# Patient Record
Sex: Female | Born: 2000 | Race: Black or African American | Hispanic: No | Marital: Single | Smoking: Never smoker
Health system: Southern US, Community
[De-identification: ages and names within clinical notes are randomized; demographics above are authoritative.]

## PROBLEM LIST (undated history)

## (undated) DIAGNOSIS — J45909 Unspecified asthma, uncomplicated: Secondary | ICD-10-CM

## (undated) HISTORY — PX: NO PAST SURGERIES: SHX2092

---

## 2019-03-23 ENCOUNTER — Inpatient Hospital Stay (HOSPITAL_COMMUNITY)
Admission: AD | Admit: 2019-03-23 | Discharge: 2019-03-23 | Disposition: A | Discharge status: Home / Self Care | Payer: PRIVATE HEALTH INSURANCE | Attending: Family Medicine | Admitting: Family Medicine

## 2019-03-23 ENCOUNTER — Inpatient Hospital Stay (HOSPITAL_COMMUNITY): Payer: PRIVATE HEALTH INSURANCE

## 2019-03-23 ENCOUNTER — Encounter (HOSPITAL_COMMUNITY): Payer: Self-pay | Admitting: Family Medicine

## 2019-03-23 ENCOUNTER — Other Ambulatory Visit: Payer: Self-pay

## 2019-03-23 DIAGNOSIS — O26891 Other specified pregnancy related conditions, first trimester: Secondary | ICD-10-CM | POA: Insufficient documentation

## 2019-03-23 DIAGNOSIS — Z3491 Encounter for supervision of normal pregnancy, unspecified, first trimester: Secondary | ICD-10-CM

## 2019-03-23 DIAGNOSIS — O209 Hemorrhage in early pregnancy, unspecified: Secondary | ICD-10-CM | POA: Diagnosis present

## 2019-03-23 DIAGNOSIS — O200 Threatened abortion: Secondary | ICD-10-CM

## 2019-03-23 DIAGNOSIS — J45909 Unspecified asthma, uncomplicated: Secondary | ICD-10-CM | POA: Diagnosis not present

## 2019-03-23 DIAGNOSIS — Z3A11 11 weeks gestation of pregnancy: Secondary | ICD-10-CM | POA: Insufficient documentation

## 2019-03-23 DIAGNOSIS — O99511 Diseases of the respiratory system complicating pregnancy, first trimester: Secondary | ICD-10-CM | POA: Diagnosis not present

## 2019-03-23 DIAGNOSIS — R109 Unspecified abdominal pain: Secondary | ICD-10-CM | POA: Insufficient documentation

## 2019-03-23 HISTORY — DX: Unspecified asthma, uncomplicated: J45.909

## 2019-03-23 LAB — HCG, QUANTITATIVE, PREGNANCY: hCG, Beta Chain, Quant, S: 147214 m[IU]/mL — ABNORMAL HIGH (ref <5)

## 2019-03-23 LAB — CBC
HCT: 33.5 % — ABNORMAL LOW (ref 36.0–46.0)
Hemoglobin: 11.9 g/dL — ABNORMAL LOW (ref 12.0–15.0)
MCH: 28.3 pg (ref 26.0–34.0)
MCHC: 35.5 g/dL (ref 30.0–36.0)
MCV: 79.6 fL — ABNORMAL LOW (ref 80.0–100.0)
Platelets: 228 10*3/uL (ref 150–400)
RBC: 4.21 MIL/uL (ref 3.87–5.11)
RDW: 13.2 % (ref 11.5–15.5)
WBC: 8.9 10*3/uL (ref 4.0–10.5)
nRBC: 0 % (ref 0.0–0.2)

## 2019-03-23 LAB — ABO/RH: ABO/RH(D): A POS

## 2019-03-23 LAB — WET PREP, GENITAL
Clue Cells Wet Prep HPF POC: NONE SEEN
Sperm: NONE SEEN
Trich, Wet Prep: NONE SEEN
Yeast Wet Prep HPF POC: NONE SEEN

## 2019-03-23 NOTE — MAU Provider Note (Signed)
Chief Complaint: Vaginal Bleeding and Abdominal Pain   First Provider Initiated Contact with Patient 03/23/19 1154     SUBJECTIVE HPI: Patricia Parsons is a 19 y.o. G1P0 at [redacted]w[redacted]d who presents to Maternity Admissions reporting vaginal bleeding. Sure LMP of 10/21. Had her first positive pregnancy test in November. Has not had any evaluation yet during this pregnancy.  Symptoms started this morning. Initially had a change in discharge from white to tan. No irritation or odor. Started with pink spotting this morning and is now more red and passing very small blood clots. Has been wearing panty liners but not saturating them. Also reports some mild abdominal cramping. No other symptoms. No recent intercourse.   Location: abdomen Quality: cramping Severity: 3/10 on pain scale Duration: 2 days Timing: intermittent Modifying factors: none Associated signs and symptoms: vaginal bleeding  Past Medical History:  Diagnosis Date  . Asthma    as child   OB History  Gravida Para Term Preterm AB Living  1            SAB TAB Ectopic Multiple Live Births               # Outcome Date GA Lbr Len/2nd Weight Sex Delivery Anes PTL Lv  1 Current            Past Surgical History:  Procedure Laterality Date  . NO PAST SURGERIES     Social History   Socioeconomic History  . Marital status: Single    Spouse name: Not on file  . Number of children: Not on file  . Years of education: Not on file  . Highest education level: Not on file  Occupational History  . Not on file  Tobacco Use  . Smoking status: Never Smoker  . Smokeless tobacco: Never Used  Substance and Sexual Activity  . Alcohol use: Never  . Drug use: Never  . Sexual activity: Yes  Other Topics Concern  . Not on file  Social History Narrative  . Not on file   Social Determinants of Health   Financial Resource Strain:   . Difficulty of Paying Living Expenses: Not on file  Food Insecurity:   . Worried About Charity fundraiser in  the Last Year: Not on file  . Ran Out of Food in the Last Year: Not on file  Transportation Needs:   . Lack of Transportation (Medical): Not on file  . Lack of Transportation (Non-Medical): Not on file  Physical Activity:   . Days of Exercise per Week: Not on file  . Minutes of Exercise per Session: Not on file  Stress:   . Feeling of Stress : Not on file  Social Connections:   . Frequency of Communication with Friends and Family: Not on file  . Frequency of Social Gatherings with Friends and Family: Not on file  . Attends Religious Services: Not on file  . Active Member of Clubs or Organizations: Not on file  . Attends Archivist Meetings: Not on file  . Marital Status: Not on file  Intimate Partner Violence:   . Fear of Current or Ex-Partner: Not on file  . Emotionally Abused: Not on file  . Physically Abused: Not on file  . Sexually Abused: Not on file   Family History  Problem Relation Age of Onset  . Healthy Mother   . Healthy Father   . Cancer Maternal Grandmother    No current facility-administered medications on file prior to encounter.  Current Outpatient Medications on File Prior to Encounter  Medication Sig Dispense Refill  . prenatal vitamin w/FE, FA (PRENATAL 1 + 1) 27-1 MG TABS tablet Take 1 tablet by mouth daily at 12 noon.     No Known Allergies  I have reviewed patient's Past Medical Hx, Surgical Hx, Family Hx, Social Hx, medications and allergies.   Review of Systems  Constitutional: Negative.   Gastrointestinal: Positive for abdominal pain.  Genitourinary: Positive for vaginal bleeding and vaginal discharge.    OBJECTIVE Patient Vitals for the past 24 hrs:  BP Temp Temp src Pulse Resp SpO2 Height Weight  03/23/19 1405 139/86 -- -- 88 -- -- -- --  03/23/19 1126 137/82 98.2 F (36.8 C) Oral 98 20 100 % 5\' 6"  (1.676 m) 63.5 kg   Constitutional: Well-developed, well-nourished female in no acute distress.  Cardiovascular: normal rate &  rhythm, no murmur Respiratory: normal rate and effort. Lung sounds clear throughout GI: Abd soft, non-tender, Pos BS x 4. No guarding or rebound tenderness MS: Extremities nontender, no edema, normal ROM Neurologic: Alert and oriented x 4.  GU:     SPECULUM EXAM: NEFG, minimal amount of dark red mucoid blood coming from os  BIMANUAL: No CMT. cervix closed; uterus normal size, no adnexal tenderness or masses.    LAB RESULTS Results for orders placed or performed during the hospital encounter of 03/23/19 (from the past 24 hour(s))  Wet prep, genital     Status: Abnormal   Collection Time: 03/23/19 12:02 PM   Specimen: Cervix  Result Value Ref Range   Yeast Wet Prep HPF POC NONE SEEN NONE SEEN   Trich, Wet Prep NONE SEEN NONE SEEN   Clue Cells Wet Prep HPF POC NONE SEEN NONE SEEN   WBC, Wet Prep HPF POC MANY (A) NONE SEEN   Sperm NONE SEEN   CBC     Status: Abnormal   Collection Time: 03/23/19 12:11 PM  Result Value Ref Range   WBC 8.9 4.0 - 10.5 K/uL   RBC 4.21 3.87 - 5.11 MIL/uL   Hemoglobin 11.9 (L) 12.0 - 15.0 g/dL   HCT 05/21/19 (L) 16.1 - 09.6 %   MCV 79.6 (L) 80.0 - 100.0 fL   MCH 28.3 26.0 - 34.0 pg   MCHC 35.5 30.0 - 36.0 g/dL   RDW 04.5 40.9 - 81.1 %   Platelets 228 150 - 400 K/uL   nRBC 0.0 0.0 - 0.2 %  ABO/Rh     Status: None   Collection Time: 03/23/19 12:11 PM  Result Value Ref Range   ABO/RH(D)      A POS Performed at [facility], [address] [place], [place]   hCG, quantitative, pregnancy     Status: Abnormal   Collection Time: 03/23/19 12:11 PM  Result Value Ref Range   hCG, Beta Chain, Quant, S 147,214 (H) <5 mIU/mL    IMAGING 05/21/19 OB LESS THAN 14 WEEKS WITH OB TRANSVAGINAL  Result Date: 03/23/2019 CLINICAL DATA:  Vaginal bleeding EXAM: OBSTETRIC <14 WK 05/21/2019 AND TRANSVAGINAL OB US TECHNIQUE: Both transabdominal and transvaginal ultrasound examinations were performed for complete evaluation of the gestation as well as the maternal uterus,  adnexal regions, and pelvic cul-de-sac. Transvaginal technique was performed to assess early pregnancy. COMPARISON:  None. FINDINGS: Intrauterine gestational sac: Single Yolk sac:  Visualized Embryo:  Visualized Cardiac Activity: Visualized Heart Rate: 179 bpm MSD:   mm    w     d  CRL:  42.5 mm   11 w   1 d                  [place] EDC: 10/11/2019 Subchorionic hemorrhage:  None visualized. Maternal uterus/adnexae: No adnexal mass or free fluid IMPRESSION: Eleven week 1 day intrauterine pregnancy. Fetal heart rate 179 beats per minute. No acute maternal findings. Electronically Signed   By: Charlett Nose M.D.   On: 03/23/2019 13:06    MAU COURSE Orders Placed This Encounter  Procedures  . Wet prep, genital  . US OB LESS THAN 14 WEEKS WITH OB TRANSVAGINAL  . CBC  . hCG, quantitative, pregnancy  . ABO/Rh  . Discharge patient   No orders of the defined types were placed in this encounter.   MDM +UPT UA, wet prep, GC/chlamydia, CBC, ABO/Rh, quant hCG, and [place] today to rule out ectopic pregnancy which can be life threatening.   RH positive Wet prep negative  Ultrasound shows live IUP.   ASSESSMENT 1. Threatened miscarriage   2. Vaginal bleeding in pregnancy, first trimester   3. Normal IUP (intrauterine pregnancy) on prenatal ultrasound, first trimester   4. [redacted] weeks gestation of pregnancy     PLAN Discharge home in stable condition. Bleeding precautions Start prenatal care GC/CT pending  Allergies as of 03/23/2019   No Known Allergies     Medication List    TAKE these medications   prenatal vitamin w/FE, FA 27-1 MG Tabs tablet Take 1 tablet by mouth daily at 12 noon.        Judeth Horn, NP 03/23/2019  2:29 PM

## 2019-03-23 NOTE — Discharge Instructions (Signed)
Vaginal Bleeding During Pregnancy, First Trimester  A small amount of bleeding from the vagina (spotting) is relatively common during early pregnancy. It usually stops on its own. Various things may cause bleeding or spotting during early pregnancy. Some bleeding may be related to the pregnancy, and some may not. In many cases, the bleeding is normal and is not a problem. However, bleeding can also be a sign of something serious. Be sure to tell your health care provider about any vaginal bleeding right away. Some possible causes of vaginal bleeding during the first trimester include:  Infection or inflammation of the cervix.  Growths (polyps) on the cervix.  Miscarriage or threatened miscarriage.  Pregnancy tissue developing outside of the uterus (ectopic pregnancy).  A mass of tissue developing in the uterus due to an egg being fertilized incorrectly (molar pregnancy). Follow these instructions at home: Activity  Follow instructions from your health care provider about limiting your activity. Ask what activities are safe for you.  If needed, make plans for someone to help with your regular activities.  Do not have sex or orgasms until your health care provider says that this is safe. General instructions  Take over-the-counter and prescription medicines only as told by your health care provider.  Pay attention to any changes in your symptoms.  Do not use tampons or douche.  Write down how many pads you use each day, how often you change pads, and how soaked (saturated) they are.  If you pass any tissue from your vagina, save the tissue so you can show it to your health care provider.  Keep all follow-up visits as told by your health care provider. This is important. Contact a health care provider if:  You have vaginal bleeding during any part of your pregnancy.  You have cramps or labor pains.  You have a fever. Get help right away if:  You have severe cramps in your  back or abdomen.  You pass large clots or a large amount of tissue from your vagina.  Your bleeding increases.  You feel light-headed or weak, or you faint.  You have chills.  You are leaking fluid or have a gush of fluid from your vagina. Summary  A small amount of bleeding (spotting) from the vagina is relatively common during early pregnancy.  Various things may cause bleeding or spotting in early pregnancy.  Be sure to tell your health care provider about any vaginal bleeding right away. This information is not intended to replace advice given to you by your health care provider. Make sure you discuss any questions you have with your health care provider. Document Revised: 06/20/2018 Document Reviewed: 06/03/2016 Elsevier Patient Education  2020 [place] for [place] at [facility]       Phone: [phone]  [facility] [place] at [place] Phone: [place] for [employer] at [place]  Phone: [place] for [place] at [facility]  Phone: [place] for [place] at [place]  Phone: [facility]       Phone: [phone]  [facility]    Phone: [phone]   [facility])    Phone: [facility]    Phone: [phone]  [facility]    Phone: [phone]   [place] [facility]  Phone: [place]  Phone: [phone]  [facility] [place]   Phone: [phone]  [facility]    Phone: [phone]     Safe Medications in Pregnancy   Acne: Benzoyl Peroxide Salicylic Acid  Backache/Headache: Tylenol: 2 regular strength every 4 hours OR              2 Extra strength every 6  hours  Colds/Coughs/Allergies: Benadryl (alcohol free) 25 mg every 6 hours as needed Breath right strips Claritin Cepacol throat lozenges Chloraseptic throat spray Cold-Eeze- up to three times per day Cough drops, alcohol free Flonase (by prescription only) Guaifenesin Mucinex Robitussin DM (plain only, alcohol free) Saline nasal spray/drops Sudafed (pseudoephedrine) & Actifed ** use only after [redacted] weeks gestation and if you do not have high blood pressure Tylenol Vicks Vaporub Zinc lozenges Zyrtec   Constipation: Colace Ducolax suppositories Fleet enema Glycerin suppositories Metamucil Milk of magnesia Miralax Senokot Smooth move tea  Diarrhea: Kaopectate Imodium A-D  *NO pepto Bismol  Hemorrhoids: Anusol Anusol HC Preparation H Tucks  Indigestion: Tums Maalox Mylanta Zantac  Pepcid  Insomnia: Benadryl (alcohol free) 25mg  every 6 hours as needed Tylenol PM Unisom, no Gelcaps  Leg Cramps: Tums MagGel  Nausea/Vomiting:  Bonine Dramamine Emetrol Ginger extract Sea bands Meclizine  Nausea medication to take during pregnancy:  Unisom (doxylamine succinate 25 mg tablets) Take one tablet daily at bedtime. If symptoms are not adequately controlled, the dose can be increased to a maximum recommended dose of two tablets daily (1/2 tablet in the morning, 1/2 tablet mid-afternoon and one at bedtime). Vitamin B6 100mg  tablets. Take one tablet twice a day (up to 200 mg per day).  Skin Rashes: Aveeno products Benadryl cream or 25mg  every 6 hours as needed Calamine Lotion 1% cortisone cream  Yeast infection: Gyne-lotrimin 7 Monistat 7  Gum/tooth pain: Anbesol  **If taking multiple medications, please check labels to avoid duplicating the same active ingredients **take medication as directed on the label ** Do not exceed 4000 mg of tylenol in 24 hours **Do not take medications that contain aspirin or ibuprofen

## 2019-03-23 NOTE — MAU Note (Signed)
Sent from [facility]  D/c had been increasing, started a few days ago, turned tan, last night it was pink, today when got up - it was dk red, more like a period, but not as heavy as a period.  While at [facility] passed a very small. Having a little bit of crarmping.  Has paper work for + preg test with her.

## 2019-03-24 ENCOUNTER — Encounter (HOSPITAL_COMMUNITY): Payer: Self-pay | Admitting: Obstetrics and Gynecology

## 2019-03-24 ENCOUNTER — Inpatient Hospital Stay (HOSPITAL_COMMUNITY)
Admission: AD | Admit: 2019-03-24 | Discharge: 2019-03-24 | Disposition: A | Discharge status: Home / Self Care | Payer: PRIVATE HEALTH INSURANCE | Attending: Obstetrics and Gynecology | Admitting: Obstetrics and Gynecology

## 2019-03-24 ENCOUNTER — Other Ambulatory Visit: Payer: Self-pay

## 2019-03-24 DIAGNOSIS — O4691 Antepartum hemorrhage, unspecified, first trimester: Secondary | ICD-10-CM | POA: Diagnosis present

## 2019-03-24 DIAGNOSIS — O209 Hemorrhage in early pregnancy, unspecified: Secondary | ICD-10-CM | POA: Diagnosis not present

## 2019-03-24 DIAGNOSIS — Z3A11 11 weeks gestation of pregnancy: Secondary | ICD-10-CM | POA: Diagnosis not present

## 2019-03-24 DIAGNOSIS — Z3491 Encounter for supervision of normal pregnancy, unspecified, first trimester: Secondary | ICD-10-CM

## 2019-03-24 NOTE — Discharge Instructions (Signed)
Return to care  °· If you have heavier bleeding that soaks through more that 2 pads per hour for an hour or more °· If you bleed so much that you feel like you might pass out or you do pass out °· If you have significant abdominal pain that is not improved with Tylenol  °· If you develop a fever > 100.5 ° °

## 2019-03-24 NOTE — MAU Note (Signed)
Pt reports to mau with c/o of vag bleeding and abd cramping.  Pt reports passing a large clot before coming in.  Pt states pain is about the same as it was when she was here yesterday.

## 2019-03-24 NOTE — MAU Provider Note (Signed)
Chief Complaint: Abdominal Pain and Vaginal Bleeding   First Provider Initiated Contact with Patient 03/24/19 1527     SUBJECTIVE HPI: Patricia Parsons is a 19 y.o. G1P0 at 101w3d who presents to Maternity Admissions reporting vaginal bleeding. Was seen in MAU yesterday for same & had a live IUP on ultrasound. No explanation for blood seen on exam or ultrasound yesterday. Patient reports that pain & bleeding have been consistent since yesterday. Returns today due to passage of a large clot. States pain has been well controlled with tylenol. Is not saturating pads.    Past Medical History:  Diagnosis Date  . Asthma    as child   OB History  Gravida Para Term Preterm AB Living  1            SAB TAB Ectopic Multiple Live Births               # Outcome Date GA Lbr Len/2nd Weight Sex Delivery Anes PTL Lv  1 Current            Past Surgical History:  Procedure Laterality Date  . NO PAST SURGERIES     Social History   Socioeconomic History  . Marital status: Single    Spouse name: Not on file  . Number of children: Not on file  . Years of education: Not on file  . Highest education level: Not on file  Occupational History  . Not on file  Tobacco Use  . Smoking status: Never Smoker  . Smokeless tobacco: Never Used  Substance and Sexual Activity  . Alcohol use: Never  . Drug use: Never  . Sexual activity: Yes  Other Topics Concern  . Not on file  Social History Narrative  . Not on file   Social Determinants of Health   Financial Resource Strain:   . Difficulty of Paying Living Expenses: Not on file  Food Insecurity:   . Worried About Charity fundraiser in the Last Year: Not on file  . Ran Out of Food in the Last Year: Not on file  Transportation Needs:   . Lack of Transportation (Medical): Not on file  . Lack of Transportation (Non-Medical): Not on file  Physical Activity:   . Days of Exercise per Week: Not on file  . Minutes of Exercise per Session: Not on file   Stress:   . Feeling of Stress : Not on file  Social Connections:   . Frequency of Communication with Friends and Family: Not on file  . Frequency of Social Gatherings with Friends and Family: Not on file  . Attends Religious Services: Not on file  . Active Member of Clubs or Organizations: Not on file  . Attends Archivist Meetings: Not on file  . Marital Status: Not on file  Intimate Partner Violence:   . Fear of Current or Ex-Partner: Not on file  . Emotionally Abused: Not on file  . Physically Abused: Not on file  . Sexually Abused: Not on file   Family History  Problem Relation Age of Onset  . Healthy Mother   . Healthy Father   . Cancer Maternal Grandmother    No current facility-administered medications on file prior to encounter.   Current Outpatient Medications on File Prior to Encounter  Medication Sig Dispense Refill  . prenatal vitamin w/FE, FA (PRENATAL 1 + 1) 27-1 MG TABS tablet Take 1 tablet by mouth daily at 12 noon.     No Known Allergies  I have reviewed patient's Past Medical Hx, Surgical Hx, Family Hx, Social Hx, medications and allergies.   Review of Systems  Constitutional: Negative.   Gastrointestinal: Positive for abdominal pain.  Genitourinary: Positive for vaginal bleeding.    OBJECTIVE Patient Vitals for the past 24 hrs:  BP Temp Temp src Pulse Resp SpO2  03/24/19 1558 122/71 -- -- 78 16 --  03/24/19 1449 137/72 98.7 F (37.1 C) Oral 97 16 100 %   Constitutional: Well-developed, well-nourished female in no acute distress.  Cardiovascular: normal rate & rhythm, no murmur Respiratory: normal rate and effort. Lung sounds clear throughout GI: Abd soft, non-tender, Pos BS x 4. No guarding or rebound tenderness MS: Extremities nontender, no edema, normal ROM Neurologic: Alert and oriented x 4.  GU:  Cervix closed. Small amount of dark red mucoid blood.   LAB RESULTS No results found for this or any previous visit (from the past 24  hour(s)).  IMAGING No results found.  MAU COURSE Orders Placed This Encounter  Procedures  . Discharge patient   No orders of the defined types were placed in this encounter.   MDM FHT present via doppler Patient had trouble hearing the heartbeat & worried during the bleeding. BSUS performed for patient reassurance & to confirm fetal viability.   Pt informed that the ultrasound is considered a limited OB ultrasound and is not intended to be a complete ultrasound exam.  Patient also informed that the ultrasound is not being completed with the intent of assessing for fetal or placental anomalies or any pelvic abnormalities.  Explained that the purpose of today's ultrasound is to assess for  viability.  Patient acknowledges the purpose of the exam and the limitations of the study.  Live IUP, FHR 140s bpm  Cervix closed. Small amount of bleeding. Vital signs stable. RH positive   ASSESSMENT 1. Vaginal bleeding in pregnancy, first trimester   2. [redacted] weeks gestation of pregnancy   3. Presence of fetal heart sounds in first trimester     PLAN Discharge home in stable condition. Bleeding/SAB precautions Start prenatal care  Allergies as of 03/24/2019   No Known Allergies     Medication List    TAKE these medications   prenatal vitamin w/FE, FA 27-1 MG Tabs tablet Take 1 tablet by mouth daily at 12 noon.        Judeth Horn, NP 03/24/2019  4:52 PM

## 2019-03-25 ENCOUNTER — Inpatient Hospital Stay (HOSPITAL_COMMUNITY): Payer: PRIVATE HEALTH INSURANCE

## 2019-03-25 ENCOUNTER — Inpatient Hospital Stay (HOSPITAL_COMMUNITY)
Admission: AD | Admit: 2019-03-25 | Discharge: 2019-03-25 | Disposition: A | Discharge status: Home / Self Care | Payer: PRIVATE HEALTH INSURANCE | Attending: Obstetrics and Gynecology | Admitting: Obstetrics and Gynecology

## 2019-03-25 ENCOUNTER — Encounter (HOSPITAL_COMMUNITY): Payer: Self-pay | Admitting: Obstetrics and Gynecology

## 2019-03-25 ENCOUNTER — Other Ambulatory Visit: Payer: Self-pay

## 2019-03-25 DIAGNOSIS — O034 Incomplete spontaneous abortion without complication: Secondary | ICD-10-CM

## 2019-03-25 DIAGNOSIS — O3680X0 Pregnancy with inconclusive fetal viability, not applicable or unspecified: Secondary | ICD-10-CM

## 2019-03-25 DIAGNOSIS — O209 Hemorrhage in early pregnancy, unspecified: Secondary | ICD-10-CM

## 2019-03-25 DIAGNOSIS — O039 Complete or unspecified spontaneous abortion without complication: Secondary | ICD-10-CM

## 2019-03-25 DIAGNOSIS — Z79899 Other long term (current) drug therapy: Secondary | ICD-10-CM | POA: Insufficient documentation

## 2019-03-25 LAB — CBC
HCT: 30.3 % — ABNORMAL LOW (ref 36.0–46.0)
Hemoglobin: 10.9 g/dL — ABNORMAL LOW (ref 12.0–15.0)
MCH: 29 pg (ref 26.0–34.0)
MCHC: 36 g/dL (ref 30.0–36.0)
MCV: 80.6 fL (ref 80.0–100.0)
Platelets: 232 10*3/uL (ref 150–400)
RBC: 3.76 MIL/uL — ABNORMAL LOW (ref 3.87–5.11)
RDW: 13.2 % (ref 11.5–15.5)
WBC: 8.8 10*3/uL (ref 4.0–10.5)
nRBC: 0 % (ref 0.0–0.2)

## 2019-03-25 LAB — HCG, QUANTITATIVE, PREGNANCY: hCG, Beta Chain, Quant, S: 63070 m[IU]/mL — ABNORMAL HIGH (ref <5)

## 2019-03-25 MED ORDER — MISOPROSTOL 200 MCG PO TABS: 600.0000 ug | ORAL_TABLET | Freq: Once | ORAL | 0 refills | Status: DC

## 2019-03-25 NOTE — Discharge Instructions (Signed)
Managing Pregnancy Loss Pregnancy loss can happen any time during a pregnancy. Often the cause is not known. It is rarely because of anything you did. Pregnancy loss in early pregnancy (during the first trimester) is called a miscarriage. This type of pregnancy loss is the most common. Pregnancy loss that happens after 20 weeks of pregnancy is called fetal demise if the baby's heart stops beating before birth. Fetal demise is much less common. Some women experience spontaneous labor shortly after fetal demise resulting in a stillborn birth (stillbirth). Any pregnancy loss can be devastating. You will need to recover both physically and emotionally. Most women are able to get pregnant again after a pregnancy loss and deliver a healthy baby. How to manage emotional recovery  Pregnancy loss is very hard emotionally. You may feel many different emotions while you grieve. You may feel sad and angry. You may also feel guilty. It is normal to have periods of crying. Emotional recovery can take longer than physical recovery. It is different for everyone. Taking these steps can help you in managing this loss:  Remember that it is unlikely you did anything to cause the pregnancy loss.  Share your thoughts and feelings with friends, family, and your partner. Remember that your partner is also recovering emotionally.  Make sure you have a good support system. Do not spend too much time alone.  Meet with a pregnancy loss counselor or join a pregnancy loss support group.  Get enough sleep and eat a healthy diet. Return to regular exercise when you have recovered physically.  Do not use drugs or alcohol to manage your emotions.  Consider seeing a mental health professional to help you recover emotionally.  Ask a friend or loved one to help you decide what to do with any clothing and nursery items you received for your baby. In the case of a stillbirth, many women benefit from taking additional steps in the  grieving process. You may want to:  Hold your baby after the birth.  Name your baby.  Request a birth certificate.  Create a keepsake such as handprints or footprints.  Dress your baby and have a picture taken.  Make funeral arrangements.  Ask for a baptism or blessing. Hospitals have staff members who can help you with all these arrangements. How to recognize emotional stress It is normal to have emotional stress after a pregnancy loss. But emotional stress that lasts a long time or becomes severe requires treatment. Watch out for these signs of severe emotional stress:  Sadness, anger, or guilt that is not going away and is interfering with your normal activities.  Relationship problems that have occurred or gotten worse since the pregnancy loss.  Signs of depression that last longer than 2 weeks. These may include: ? Sadness. ? Anxiety. ? Hopelessness. ? Loss of interest in activities you enjoy. ? Inability to concentrate. ? Trouble sleeping or sleeping too much. ? Loss of appetite or overeating. ? Thoughts of death or of hurting yourself. Follow these instructions at home:  Take over-the-counter and prescription medicines only as told by your health care provider.  Rest at home until your energy level returns. Return to your normal activities as told by your health care provider. Ask your health care provider what activities are safe for you.  When you are ready, meet with your health care provider to discuss steps to take for a future pregnancy.  Keep all follow-up visits as told by your health care provider. This is important.  Where to find support  To help you and your partner with the process of grieving, talk with your health care provider or seek counseling.  Consider meeting with others who have experienced pregnancy loss. Ask your health care provider about support groups and resources. Where to find more information  [place] Department of Health and Human  Services Office on Women's Health: www.womenshealth.gov  American Pregnancy Association: www.americanpregnancy.org Contact a health care provider if:  You continue to experience grief, sadness, or lack of motivation for everyday activities, and those feelings do not improve over time.  You are struggling to recover emotionally, especially if you are using alcohol or substances to help. Get help right away if:  You have thoughts of hurting yourself or others. If you ever feel like you may hurt yourself or others, or have thoughts about taking your own life, get help right away. You can go to your nearest emergency department or call:  Your local emergency services (911 in the [place]  A suicide crisis helpline, such as the National Suicide Prevention Lifeline at 1-800-273-8255. This is open 24 hours a day. Summary  Any pregnancy loss can be difficult physically and emotionally.  You may experience many different emotions while you grieve. Emotional recovery can last longer than physical recovery.  It is normal to have emotional stress after a pregnancy loss. But emotional stress that lasts a long time or becomes severe requires treatment.  See your health care provider if you are struggling emotionally after a pregnancy loss. This information is not intended to replace advice given to you by your health care provider. Make sure you discuss any questions you have with your health care provider. Document Revised: 06/21/2018 Document Reviewed: 05/12/2017 Elsevier Patient Education  2020 Elsevier Inc.      Miscarriage A miscarriage is the loss of an unborn baby (fetus) before the 20th week of pregnancy. Most miscarriages happen during the first 3 months of pregnancy. Sometimes, a miscarriage can happen before a woman knows that she is pregnant. Having a miscarriage can be an emotional experience. If you have had a miscarriage, talk with your health care provider about any questions you  may have about miscarrying, the grieving process, and your plans for future pregnancy. What are the causes? A miscarriage may be caused by:  Problems with the genes or chromosomes of the fetus. These problems make it impossible for the baby to develop normally. They are often the result of random errors that occur early in the development of the baby, and are not passed from parent to child (not inherited).  Infection of the cervix or uterus.  Conditions that affect hormone balance in the body.  Problems with the cervix, such as the cervix opening and thinning before pregnancy is at term (cervical insufficiency).  Problems with the uterus. These may include: ? A uterus with an abnormal shape. ? Fibroids in the uterus. ? Congenital abnormalities. These are problems that were present at birth.  Certain medical conditions.  Smoking, drinking alcohol, or using drugs.  Injury (trauma). In many cases, the cause of a miscarriage is not known. What are the signs or symptoms? Symptoms of this condition include:  Vaginal bleeding or spotting, with or without cramps or pain.  Pain or cramping in the abdomen or lower back.  Passing fluid, tissue, or blood clots from the vagina. How is this diagnosed? This condition may be diagnosed based on:  A physical exam.  Ultrasound.  Blood tests.  Urine tests. How   is this treated? Treatment for a miscarriage is sometimes not necessary if you naturally pass all the tissue that was in your uterus. If necessary, this condition may be treated with:  Dilation and curettage (D&C). This is a procedure in which the cervix is stretched open and the lining of the uterus (endometrium) is scraped. This is done only if tissue from the fetus or placenta remains in the body (incomplete miscarriage).  Medicines, such as: ? Antibiotic medicine, to treat infection. ? Medicine to help the body pass any remaining tissue. ? Medicine to reduce (contract) the  size of the uterus. These medicines may be given if you have a lot of bleeding. If you have Rh negative blood and your baby was Rh positive, you will need a shot of a medicine called Rh immunoglobulinto protect your future babies from Rh blood problems. "Rh-negative" and "Rh-positive" refer to whether or not the blood has a specific protein found on the surface of red blood cells (Rh factor). Follow these instructions at home: Medicines   Take over-the-counter and prescription medicines only as told by your health care provider.  If you were prescribed antibiotic medicine, take it as told by your health care provider. Do not stop taking the antibiotic even if you start to feel better.  Do not take NSAIDs, such as aspirin and ibuprofen, unless they are approved by your health care provider. These medicines can cause bleeding. Activity  Rest as directed. Ask your health care provider what activities are safe for you.  Have someone help with home and family responsibilities during this time. General instructions  Keep track of the number of sanitary pads you use each day and how soaked (saturated) they are. Write down this information.  Monitor the amount of tissue or blood clots that you pass from your vagina. Save any large amounts of tissue for your health care provider to examine.  Do not use tampons, douche, or have sex until your health care provider approves.  To help you and your partner with the process of grieving, talk with your health care provider or seek counseling.  When you are ready, meet with your health care provider to discuss any important steps you should take for your health. Also, discuss steps you should take to have a healthy pregnancy in the future.  Keep all follow-up visits as told by your health care provider. This is important. Where to find more information  The American Congress of Obstetricians and Gynecologists: www.acog.org  [place].S. Department of Health  and Human Services Office of Women's Health: www.womenshealth.gov Contact a health care provider if:  You have a fever or chills.  You have a foul smelling vaginal discharge.  You have more bleeding instead of less. Get help right away if:  You have severe cramps or pain in your back or abdomen.  You pass blood clots or tissue from your vagina that is walnut-sized or larger.  You soak more than 1 regular sanitary pad in an hour.  You become light-headed or weak.  You pass out.  You have feelings of sadness that take over your thoughts, or you have thoughts of hurting yourself. Summary  Most miscarriages happen in the first 3 months of pregnancy. Sometimes miscarriage happens before a woman even knows that she is pregnant.  Follow your health care provider's instruction for home care. Keep all follow-up appointments.  To help you and your partner with the process of grieving, talk with your health care provider or seek   counseling. This information is not intended to replace advice given to you by your health care provider. Make sure you discuss any questions you have with your health care provider. Document Revised: 06/23/2018 Document Reviewed: 04/06/2016 Elsevier Patient Education  2020 Elsevier Inc.  

## 2019-03-25 NOTE — MAU Provider Note (Signed)
History     CSN: [id number]  Arrival date and time: 03/25/19 0501   First Provider Initiated Contact with Patient 03/25/19 [phone]      Chief Complaint  Patient presents with  . Vaginal Bleeding  . Abdominal Pain   Patricia Parsons is a 19 y.o. G1P0 at [redacted]w[redacted]d.  She presents today for Vaginal Bleeding and Abdominal Pain.  She states she has been bleeding for the past 3 days, but bleeding picked up yesterday morning.  She states she was saturating through her clothing and unable to "do simple tasks like going to the bathroom."  She states the pain became "excruiating" and she then passed a large clot.  She states since passing the large clot the bleeding has lessened and the pain is now only intermittent.  Patient reports her pain currently an 8/10, but it is intermittent rather than constant.   Of note patient did provide large clot and upon provider inspection it is suspicious for POC.      OB History    Gravida  1   Para      Term      Preterm      AB      Living        SAB      TAB      Ectopic      Multiple      Live Births              Past Medical History:  Diagnosis Date  . Asthma    as child    Past Surgical History:  Procedure Laterality Date  . NO PAST SURGERIES      Family History  Problem Relation Age of Onset  . Healthy Mother   . Healthy Father   . Cancer Maternal Grandmother     Social History   Tobacco Use  . Smoking status: Never Smoker  . Smokeless tobacco: Never Used  Substance Use Topics  . Alcohol use: Never  . Drug use: Never    Allergies: No Known Allergies  Medications Prior to Admission  Medication Sig Dispense Refill Last Dose  . prenatal vitamin w/FE, FA (PRENATAL 1 + 1) 27-1 MG TABS tablet Take 1 tablet by mouth daily at 12 noon.       Review of Systems  Gastrointestinal: Positive for abdominal pain (Intermittent).  Genitourinary: Positive for vaginal bleeding. Negative for difficulty urinating, dysuria and  vaginal discharge.   Physical Exam   Blood pressure 139/82, pulse 93, temperature 99.3 F (37.4 C), temperature source Oral, resp. rate 17, weight 62.9 kg, last menstrual period 01/03/2019, SpO2 100 %.  Physical Exam  Constitutional: She is oriented to person, place, and time. She appears well-developed and well-nourished.  HENT:  Head: Normocephalic and atraumatic.  Eyes: Conjunctivae are normal.  Cardiovascular: Normal rate.  Respiratory: Effort normal.  GI: Soft. There is no abdominal tenderness. There is no guarding.  Genitourinary: Uterus is enlarged. Cervix exhibits no motion tenderness, no discharge and no friability.    Vaginal bleeding present.  There is bleeding in the vagina.    Genitourinary Comments: Speculum Exam: -Normal External Genitalia: Non tender,Scant blood at introitus.  -Vaginal Vault: Pink mucosa with good rugae. Scant amt blood with one dime sized clot removed with faux swab -Cervix:Pink, no lesions, cysts, or polyps.  Appears closed. No active bleeding from os- -Bimanual Exam:  Uterus c/w 10-12 week size.  No tenderness.   Musculoskeletal:  General: Normal range of motion.     Cervical back: Normal range of motion.  Neurological: She is alert and oriented to person, place, and time.  Skin: Skin is warm and dry.  Psychiatric: She has a normal mood and affect. Her behavior is normal.    MAU Course  Procedures Results for orders placed or performed during the hospital encounter of 03/25/19 (from the past 24 hour(s))  CBC     Status: Abnormal   Collection Time: 03/25/19  5:52 AM  Result Value Ref Range   WBC 8.8 4.0 - 10.5 K/uL   RBC 3.76 (L) 3.87 - 5.11 MIL/uL   Hemoglobin 10.9 (L) 12.0 - 15.0 g/dL   HCT 30.3 (L) 36.0 - 46.0 %   MCV 80.6 80.0 - 100.0 fL   MCH 29.0 26.0 - 34.0 pg   MCHC 36.0 30.0 - 36.0 g/dL   RDW 13.2 11.5 - 15.5 %   Platelets 232 150 - 400 K/uL   nRBC 0.0 0.0 - 0.2 %  hCG, quantitative, pregnancy     Status: Abnormal    Collection Time: 03/25/19  5:52 AM  Result Value Ref Range   hCG, Beta Chain, Quant, S 63,070 (H) <5 mIU/mL   US OB Transvaginal  Result Date: 03/25/2019 CLINICAL DATA:  Gestation identified on recent ultrasound. Now with vaginal bleeding. EXAM: TRANSVAGINAL OB ULTRASOUND TECHNIQUE: Transvaginal ultrasound was performed for complete evaluation of the gestation as well as the maternal uterus, adnexal regions, and pelvic cul-de-sac. COMPARISON:  03/23/2019. FINDINGS: Intrauterine gestational sac: Intrauterine gestational sac identified on previous study is no longer visible. Endometrial cavity is filled with heterogeneous material. Endometrial thickness is measured at 21 mm. Color Doppler evaluation shows some minimal color signal in the endometrium at the anterior fundus. Subchorionic hemorrhage:  N/A Maternal uterus/adnexae: Adnexal regions not visualized. IMPRESSION: No intrauterine gestational sac with thickened heterogeneous endometrium measuring up to 21 mm. No discrete or focal mass identified with minimal color noted along the endometrial contents of the anterior fundal region. Endometrial thickening likely related to blood products although component of retained products of conception towards the fundus not entirely excluded. Electronically Signed   By: Misty Stanley M.D.   On: 03/25/2019 06:48     MDM Pelvic Exam Labs: UA, CBC, hCG Ultrasound  Assessment and Plan  19 year old, G1P0  SIUP at 4.4weeks Suspected SAB  -Reviewed POC with patient. -Informed that "clot" is suspicious for products of conception (GS). -Exam performed and findings discussed.  -Will collect CBC and hCG -Patient offered and declined pain medication -Will send for [place] and await results.  -Suspected POC sent to pathology.  Maryann Conners, MSN, CNM 03/25/2019, 5:38 AM   Reassessment (7:07 AM) Incomplete Abortion Questionable Retained POCs  -Patient informed of US findings. -Condolences given regarding  loss. -Questions addressed regarding reason for miscarriage. -Reassured that cause not due to something that she did.  -Patient requests that FOB be at bedside and provider states this is okay. Nurse informed. -Dr. Sheryn Bison consulted regarding US findings suggestive of retained POC. Advised: *Schedule for repeat US on Tuesday with MD appt following.  *Order Cytotec 600PV to be placed today or tomorrow. -Patient updated on POC.  Reviewed proper usage and expectations with cytotec dosing. -Patient further informed that office would be contacting her regarding follow up appt and [place]. -Patient and FOB without questions -Message sent to [facility] office for scheduling of follow up appt as advised.  -[place] order placed. -Bleeding Precautions Given. -  Discharged to home in stable condition.  Cherre Robins MSN, CNM Advanced Practice Provider, [facility]

## 2019-03-25 NOTE — MAU Note (Signed)
Patient states she has been here the past 3 days d/t abdominal pain and bleeding w/ clots.  She passed something tonight that may look like tissue.  States the pain and bleeding improved after passing it.  Last took tylenol for her pain at 2100.

## 2019-03-26 ENCOUNTER — Telehealth: Payer: Self-pay | Admitting: Student

## 2019-03-26 LAB — GC/CHLAMYDIA PROBE AMP (~~LOC~~) NOT AT ARMC
Chlamydia: NEGATIVE
Comment: NEGATIVE
Comment: NORMAL
Neisseria Gonorrhea: NEGATIVE

## 2019-03-26 NOTE — Telephone Encounter (Signed)
Called the patient to inform of the upcoming appointment, the patient verbalized understanding and agreed to the upcoming date and time.

## 2019-03-27 ENCOUNTER — Ambulatory Visit (HOSPITAL_COMMUNITY)
Admission: RE | Admit: 2019-03-27 | Discharge: 2019-03-27 | Disposition: A | Discharge status: Home / Self Care | Payer: PRIVATE HEALTH INSURANCE | Source: Ambulatory Visit

## 2019-03-27 ENCOUNTER — Ambulatory Visit (INDEPENDENT_AMBULATORY_CARE_PROVIDER_SITE_OTHER): Payer: PRIVATE HEALTH INSURANCE | Admitting: Student

## 2019-03-27 ENCOUNTER — Encounter: Payer: Self-pay | Admitting: Student

## 2019-03-27 ENCOUNTER — Other Ambulatory Visit: Payer: Self-pay

## 2019-03-27 VITALS — BP 110/71 | HR 75 | Temp 98.3°F | Ht 66.0 in | Wt 138.2 lb

## 2019-03-27 DIAGNOSIS — O039 Complete or unspecified spontaneous abortion without complication: Secondary | ICD-10-CM

## 2019-03-27 DIAGNOSIS — O3680X0 Pregnancy with inconclusive fetal viability, not applicable or unspecified: Secondary | ICD-10-CM | POA: Insufficient documentation

## 2019-03-27 LAB — SURGICAL PATHOLOGY

## 2019-03-27 NOTE — Progress Notes (Signed)
Pt here for [place] results from today. She reports that she has been having heavy bleeding daily - saturating a pad every 1.5-3 hours. She is also having cramping and takes Tylenol with good results. Pt states she used the Cytotec yesterday @ 12 noon.

## 2019-03-27 NOTE — Progress Notes (Signed)
Patient ID: Patricia Parsons, female   DOB: 09/24/2000, 19 y.o.   MRN: [id number]  History:  Ms. Shaneta Cervenka is a 19 y.o. G1P0 who presents to clinic today for follow up from SAB. She denies strong pains; it has not been "excruciating". Pain is a 2/10. She reports that she is saturating a pad every 3 hours, but it is slowing down. No odor, no tenderness, no fever.   The following portions of the patient's history were reviewed and updated as appropriate: allergies, current medications, family history, past medical history, social history, past surgical history and problem list.  Review of Systems:  Review of Systems  Constitutional: Negative.   HENT: Negative.   Gastrointestinal: Negative.   Skin: Negative.   Neurological: Negative.  Negative for dizziness.      Objective:  Physical Exam BP 110/71   Pulse 75   Temp 98.3 F (36.8 C)   Ht 5\' 6"  (1.676 m)   Wt 138 lb 3.2 oz (62.7 kg)   LMP 01/03/2019   Breastfeeding Unknown   BMI 22.31 kg/m  Physical Exam  Constitutional: She is oriented to person, place, and time. She appears well-developed and well-nourished.  HENT:  Head: Normocephalic.  Musculoskeletal:     Cervical back: Normal range of motion.  Neurological: She is alert and oriented to person, place, and time.  Skin: Skin is warm and dry.  Psychiatric: She has a normal mood and affect.      Labs and Imaging No results found for this or any previous visit (from the past 24 hour(s)).  01/05/2019 OB Transvaginal  Result Date: 03/27/2019 CLINICAL DATA:  Incomplete abortion EXAM: TRANSVAGINAL OB ULTRASOUND TECHNIQUE: Transvaginal ultrasound was performed for complete evaluation of the gestation as well as the maternal uterus, adnexal regions, and pelvic cul-de-sac. COMPARISON:  03/25/2019 FINDINGS: Intrauterine gestational sac: Absent Yolk sac:  N/A Embryo:  N/A Cardiac Activity: N/A Heart Rate: N/A bpm MSD:   mm    w     d CRL:     mm    w  d                  05/23/2019 EDC: Subchorionic  hemorrhage:  N/A Maternal uterus/adnexae: Uterus anteverted, 10.1 x 5.7 x 5.7 cm in size. Endometrial complex appears prominent up to 15 mm thick, mildly heterogeneous. No endometrial fluid, mass, or retained products of conception identified. RIGHT ovary normal size and morphology 2.2 x 4.0 x 2.2 cm. LEFT ovary normal size and morphology 2.1 x 3.8 x 3.2 cm. No free pelvic fluid or adnexal masses IMPRESSION: No intrauterine gestation identified. Endometrial complex prominent at 15 mm thick though no mass, fluid or definite retained products of conception identified. Electronically Signed   By: [place] M.D.   On: 03/27/2019 16:11     Assessment & Plan:  1. Miscarriage --Reviewed pathology report, which reported chorionic villi in the specimen.  Reviewed warning signs and when to return to MAU; reviewed bleeding precautions. Will repeat CBC today, but I explained to patient that she may bleed like a period for the next two weeks. Abstain from intercourse until her period returns; use a barrier method if she does not wish to use a hormonal method.  - CBC  05/25/2019, CNM 03/27/2019 4:34 PM

## 2019-03-28 LAB — CBC
Hematocrit: 31.3 % — ABNORMAL LOW (ref 34.0–46.6)
Hemoglobin: 10.6 g/dL — ABNORMAL LOW (ref 11.1–15.9)
MCH: 28.7 pg (ref 26.6–33.0)
MCHC: 33.9 g/dL (ref 31.5–35.7)
MCV: 85 fL (ref 79–97)
Platelets: 244 10*3/uL (ref 150–450)
RBC: 3.69 x10E6/uL — ABNORMAL LOW (ref 3.77–5.28)
RDW: 13.4 % (ref 11.7–15.4)
WBC: 7.3 10*3/uL (ref 3.4–10.8)

## 2019-04-16 ENCOUNTER — Other Ambulatory Visit: Payer: Self-pay | Admitting: *Deleted

## 2019-04-16 ENCOUNTER — Other Ambulatory Visit: Payer: Self-pay

## 2019-04-16 ENCOUNTER — Other Ambulatory Visit: Payer: PRIVATE HEALTH INSURANCE

## 2019-04-16 DIAGNOSIS — O039 Complete or unspecified spontaneous abortion without complication: Secondary | ICD-10-CM

## 2019-04-17 ENCOUNTER — Other Ambulatory Visit: Payer: Self-pay | Admitting: Student

## 2019-04-17 ENCOUNTER — Telehealth: Payer: Self-pay | Admitting: Obstetrics and Gynecology

## 2019-04-17 DIAGNOSIS — O039 Complete or unspecified spontaneous abortion without complication: Secondary | ICD-10-CM

## 2019-04-17 LAB — BETA HCG QUANT (REF LAB): hCG Quant: 97 m[IU]/mL

## 2019-04-17 NOTE — Telephone Encounter (Signed)
Called the patient to schedule the upcoming visit for a stat beta per the provider request. Left a voicemail advising of the appointment and also stating if the time and date does not accommodate your schedule please give our office a call back to reschedule for a different day.

## 2019-04-24 ENCOUNTER — Other Ambulatory Visit: Payer: Self-pay

## 2019-04-24 ENCOUNTER — Ambulatory Visit (INDEPENDENT_AMBULATORY_CARE_PROVIDER_SITE_OTHER): Payer: PRIVATE HEALTH INSURANCE

## 2019-04-24 DIAGNOSIS — O039 Complete or unspecified spontaneous abortion without complication: Secondary | ICD-10-CM

## 2019-04-24 DIAGNOSIS — O034 Incomplete spontaneous abortion without complication: Secondary | ICD-10-CM

## 2019-04-24 LAB — BETA HCG QUANT (REF LAB): hCG Quant: 36 m[IU]/mL

## 2019-04-24 NOTE — Addendum Note (Signed)
Addended by: Faythe Casa on: 04/24/2019 01:45 PM   Modules accepted: Orders

## 2019-04-24 NOTE — Progress Notes (Signed)
Patient seen and assessed by nursing staff during this encounter. I have reviewed the chart and agree with the documentation and plan.  Catalina Antigua, MD 04/24/2019 2:45 PM

## 2019-04-24 NOTE — Progress Notes (Signed)
Pt here today for STAT Beta Lab for SAB.  Pt explained to me that she had a miscarriage is here to make sure that her levels are decreasing.  Pt denies any vaginal bleeding or pain.  Pt advised that I would call in approximately two hours with results and f/u.  Pt verbalized understanding.   Received results of beta from [facility] of [facility].  Notified Dr. Jolayne Panther who recommended that pt come in one week for beta to follow levels to zero.  Notified pt and pt informed me that she will to come in on 05/01/19 @ 1030.  Front office to place pt on schedule for non stat beta.    Addison Naegeli, RN 04/24/19

## 2019-05-01 ENCOUNTER — Other Ambulatory Visit: Payer: Self-pay

## 2019-05-01 ENCOUNTER — Other Ambulatory Visit: Payer: PRIVATE HEALTH INSURANCE

## 2019-05-01 DIAGNOSIS — O039 Complete or unspecified spontaneous abortion without complication: Secondary | ICD-10-CM

## 2019-05-02 ENCOUNTER — Telehealth (INDEPENDENT_AMBULATORY_CARE_PROVIDER_SITE_OTHER): Payer: PRIVATE HEALTH INSURANCE

## 2019-05-02 DIAGNOSIS — O039 Complete or unspecified spontaneous abortion without complication: Secondary | ICD-10-CM

## 2019-05-02 LAB — BETA HCG QUANT (REF LAB): hCG Quant: 18 m[IU]/mL

## 2019-05-02 NOTE — Telephone Encounter (Addendum)
-----   Message from Catalina Antigua, MD sent at 05/02/2019  1:36 PM EST ----- Please inform patient of continue drop in HCG. She needs to return next week for repeat quant HCG  Thanks  [employer] pt; results given. Explained to pt she will need to have beta HCG lab drawn in 1 week. Front office notified to schedule pt.

## 2019-05-09 ENCOUNTER — Other Ambulatory Visit: Payer: Self-pay

## 2019-05-09 ENCOUNTER — Other Ambulatory Visit: Payer: PRIVATE HEALTH INSURANCE

## 2019-05-09 DIAGNOSIS — O039 Complete or unspecified spontaneous abortion without complication: Secondary | ICD-10-CM

## 2019-05-10 ENCOUNTER — Telehealth (INDEPENDENT_AMBULATORY_CARE_PROVIDER_SITE_OTHER): Payer: PRIVATE HEALTH INSURANCE | Admitting: Lactation Services

## 2019-05-10 DIAGNOSIS — O039 Complete or unspecified spontaneous abortion without complication: Secondary | ICD-10-CM

## 2019-05-10 LAB — BETA HCG QUANT (REF LAB): hCG Quant: 10 m[IU]/mL

## 2019-05-10 NOTE — Telephone Encounter (Signed)
-----   Message from Catalina Antigua, MD sent at 05/10/2019 10:03 AM EST ----- Patient needs to return next week for quant HCG (hopefully it will be her last time)

## 2019-05-10 NOTE — Telephone Encounter (Signed)
Called patient to inform her of Hcg Results. Patient informed that she needs a follow up HCG. Patient agreed to come in tomorrow at 11 am for non stat Hcg. Patient informed that the level will not be back until Monday. Message to front office to add to lab schedule.

## 2019-05-11 ENCOUNTER — Other Ambulatory Visit: Payer: PRIVATE HEALTH INSURANCE

## 2019-05-14 ENCOUNTER — Other Ambulatory Visit: Payer: PRIVATE HEALTH INSURANCE

## 2019-05-14 ENCOUNTER — Other Ambulatory Visit: Payer: Self-pay

## 2019-05-14 DIAGNOSIS — O039 Complete or unspecified spontaneous abortion without complication: Secondary | ICD-10-CM

## 2019-05-15 ENCOUNTER — Telehealth: Payer: Self-pay

## 2019-05-15 LAB — BETA HCG QUANT (REF LAB): hCG Quant: 7 m[IU]/mL

## 2019-05-15 NOTE — Telephone Encounter (Signed)
Per chart review, message sent by Addison Naegeli, RN has been read by patient. Called pt; VM left stating pt may respond to MyChart message or call the office to schedule an appt. Call back number given.

## 2019-05-15 NOTE — Telephone Encounter (Addendum)
-----   Message from Catalina Antigua, MD sent at 05/15/2019 10:28 AM EST ----- Please inform patient of resolution of pregnancy. Please schedule virtual visit to discuss family planning   Left message for pt that I am calling with results. If she could please call the office.  MyChart message sent.

## 2020-10-02 ENCOUNTER — Ambulatory Visit (INDEPENDENT_AMBULATORY_CARE_PROVIDER_SITE_OTHER): Payer: 59 | Admitting: Nurse Practitioner

## 2020-10-02 ENCOUNTER — Other Ambulatory Visit: Payer: Self-pay

## 2020-10-02 ENCOUNTER — Encounter: Payer: Self-pay | Admitting: Nurse Practitioner

## 2020-10-02 VITALS — BP 110/78 | HR 98 | Temp 98.1°F | Wt 131.4 lb

## 2020-10-02 DIAGNOSIS — Z7689 Persons encountering health services in other specified circumstances: Secondary | ICD-10-CM | POA: Diagnosis not present

## 2020-10-02 DIAGNOSIS — N390 Urinary tract infection, site not specified: Secondary | ICD-10-CM | POA: Diagnosis not present

## 2020-10-02 DIAGNOSIS — Z114 Encounter for screening for human immunodeficiency virus [HIV]: Secondary | ICD-10-CM

## 2020-10-02 DIAGNOSIS — R238 Other skin changes: Secondary | ICD-10-CM

## 2020-10-02 DIAGNOSIS — Z8616 Personal history of COVID-19: Secondary | ICD-10-CM

## 2020-10-02 DIAGNOSIS — N898 Other specified noninflammatory disorders of vagina: Secondary | ICD-10-CM

## 2020-10-02 LAB — POCT URINALYSIS DIPSTICK
Bilirubin, UA: NEGATIVE
Blood, UA: NEGATIVE
Glucose, UA: NEGATIVE
Ketones, UA: NEGATIVE
Leukocytes, UA: NEGATIVE
Nitrite, UA: POSITIVE
Protein, UA: POSITIVE — AB
Spec Grav, UA: 1.02 (ref 1.010–1.025)
Urobilinogen, UA: 0.2 E.U./dL
pH, UA: 6 (ref 5.0–8.0)

## 2020-10-02 MED ORDER — NITROFURANTOIN MONOHYD MACRO 100 MG PO CAPS: 100.0000 mg | ORAL_CAPSULE | Freq: Two times a day (BID) | ORAL | 0 refills | Status: AC

## 2020-10-02 MED ORDER — SELENIUM SULFIDE 1 % EX LOTN: 1.0000 "application " | TOPICAL_LOTION | Freq: Every day | CUTANEOUS | Status: AC

## 2020-10-02 NOTE — Progress Notes (Signed)
I,Yamilka Roman [employer] as a Neurosurgeon for [employer], FNP.,have documented all relevant documentation on the behalf of Arnette Felts, FNP,as directed by  Arnette Felts, FNP while in the presence of Arnette Felts, FNP.  This visit occurred during the SARS-CoV-2 public health emergency.  Safety protocols were in place, including screening questions prior to the visit, additional usage of staff PPE, and extensive cleaning of exam room while observing appropriate contact time as indicated for disinfecting solutions.  Subjective:     Patient ID: Patricia Parsons , female    DOB: 11-Apr-2000 , 20 y.o.   MRN: [id number]   Chief Complaint  Patient presents with   Establish Care   dry scalp   vaginal odor    HPI  Patient presents today to establish care. She was going to a provider on campus. 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 She is originally from [place]. She has a boyfriend. No children. She had been going to [facility] in [place].   She would like to be seen for her scalp she stated it has been dry. She also reports having a strong odor in her urine.  Her grandmother passed from breast cancer. Older brother who is healthy.  She had a miscarriage in January 10th - at 12 weeks.  No current birth control.   .Wt Readings from Last 3 Encounters: 10/02/20 : 131 lb 6.4 oz (59.6 kg) 03/27/19 : 138 lb 3.2 oz (62.7 kg) (67 %, Z= 0.44)* 03/25/19 : 138 lb 9 oz (62.9 kg) (67 %, Z= 0.45)*  She had covid April 2021 - just lost her taste.   She has a vaginal odor - like ammonia or egg smell. Denies vaginal itching or burning, frequency in urination.   Scalp is dry -  She exercises regularly. Has been going on since 6 months after having washed improves. Will flake at times. She is not using any products to her hair. No relaxers to her hair.     Past Medical History:  Diagnosis Date   Asthma    as child     Family History  Problem Relation Age of Onset   Healthy Mother     Healthy Father    Cancer Maternal Grandmother      Current Outpatient Medications:    selenium sulfide (SELSUN) 1 % LOTN, Apply 1 application topically daily., Disp: 207 mL, Rfl: ML   No Known Allergies   Review of Systems  Constitutional: Negative.   Eyes: Negative.   Gastrointestinal: Negative.   Musculoskeletal: Negative.   Psychiatric/Behavioral: Negative.      Today's Vitals   10/02/20 1200  BP: 110/78  Pulse: 98  Temp: 98.1 F (36.7 C)  Weight: 131 lb 6.4 oz (59.6 kg)  PainSc: 0-No pain   Body mass index is 21.21 kg/m.   Objective:  Physical Exam Vitals reviewed.  Constitutional:      General: She is not in acute distress.    Appearance: Normal appearance. She is well-developed.  HENT:     Head: Normocephalic and atraumatic.  Eyes:     Pupils: Pupils are equal, round, and reactive to light.  Cardiovascular:     Rate and Rhythm: Normal rate and regular rhythm.     Pulses: Normal pulses.     Heart sounds: Normal heart sounds. No murmur heard. Pulmonary:     Effort: Pulmonary effort is normal. No respiratory distress.     Breath sounds: Normal breath sounds. No wheezing.  Musculoskeletal:  General: Normal range of motion.  Skin:    General: Skin is warm and dry.     Capillary Refill: Capillary refill takes less than 2 seconds.     Comments: Scalp has scaly areas throughout  Neurological:     General: No focal deficit present.     Mental Status: She is alert and oriented to person, place, and time.     Cranial Nerves: No cranial nerve deficit.     Motor: No weakness.  Psychiatric:        Mood and Affect: Mood normal.        Behavior: Behavior normal.        Thought Content: Thought content normal.        Judgment: Judgment normal.        Assessment And Plan:     1. Vaginal odor Comments: Will send urine for culture Will check for STDs - POCT Urinalysis Dipstick (81002)  2. Dry scalp Comments: Dry scaly scalp throughout Will refer to  Dermatology for further evaluation - Ambulatory referral to Dermatology - TSH - CBC - CMP14+EGFR - selenium sulfide (SELSUN) 1 % LOTN; Apply 1 application topically daily.  Dispense: 207 mL; Refill: ML  3. Urinary tract infection without hematuria, site unspecified Comments: Urinalysis is positive for nitrites, will treat with antibiotics Will send urine for culture - nitrofurantoin, macrocrystal-monohydrate, (MACROBID) 100 MG capsule; Take 1 capsule (100 mg total) by mouth 2 (two) times daily for 5 days.  Dispense: 10 capsule; Refill: 0 - Culture, Urine  4. History of COVID-19  5. Encounter for HIV (human immunodeficiency virus) test - HIV Antibody (routine testing w rflx)  6. Establishing care with new doctor, encounter for    Patient was given opportunity to ask questions. Patient verbalized understanding of the plan and was able to repeat key elements of the plan. All questions were answered to their satisfaction.  Minette Brine, FNP   I, Minette Brine, FNP, have reviewed all documentation for this visit. The documentation on 10/02/20 for the exam, diagnosis, procedures, and orders are all accurate and complete.    IF YOU HAVE BEEN REFERRED TO A SPECIALIST, IT MAY TAKE 1-2 WEEKS TO SCHEDULE/PROCESS THE REFERRAL. IF YOU HAVE NOT HEARD FROM US/SPECIALIST IN TWO WEEKS, PLEASE GIVE [place] A CALL AT [phone].   THE PATIENT IS ENCOURAGED TO PRACTICE SOCIAL DISTANCING DUE TO THE COVID-19 PANDEMIC.

## 2020-10-03 LAB — CMP14+EGFR
ALT: 15 IU/L (ref 0–32)
AST: 20 IU/L (ref 0–40)
Albumin/Globulin Ratio: 2 (ref 1.2–2.2)
Albumin: 4.8 g/dL (ref 3.9–5.0)
Alkaline Phosphatase: 72 IU/L (ref 44–121)
BUN/Creatinine Ratio: 9 (ref 9–23)
BUN: 9 mg/dL (ref 6–20)
Bilirubin Total: 0.7 mg/dL (ref 0.0–1.2)
CO2: 24 mmol/L (ref 20–29)
Calcium: 10.2 mg/dL (ref 8.7–10.2)
Chloride: 100 mmol/L (ref 96–106)
Creatinine, Ser: 1 mg/dL (ref 0.57–1.00)
Globulin, Total: 2.4 g/dL (ref 1.5–4.5)
Glucose: 80 mg/dL (ref 65–99)
Potassium: 4.7 mmol/L (ref 3.5–5.2)
Sodium: 139 mmol/L (ref 134–144)
Total Protein: 7.2 g/dL (ref 6.0–8.5)
eGFR: 82 mL/min/{1.73_m2} (ref >59)

## 2020-10-03 LAB — CBC
Hematocrit: 33.7 % — ABNORMAL LOW (ref 34.0–46.6)
Hemoglobin: 11.6 g/dL (ref 11.1–15.9)
MCH: 28.2 pg (ref 26.6–33.0)
MCHC: 34.4 g/dL (ref 31.5–35.7)
MCV: 82 fL (ref 79–97)
Platelets: 237 10*3/uL (ref 150–450)
RBC: 4.12 x10E6/uL (ref 3.77–5.28)
RDW: 12.9 % (ref 11.7–15.4)
WBC: 3.9 10*3/uL (ref 3.4–10.8)

## 2020-10-03 LAB — TSH: TSH: 5.02 u[IU]/mL — ABNORMAL HIGH (ref 0.450–4.500)

## 2020-10-03 LAB — HIV ANTIBODY (ROUTINE TESTING W REFLEX): HIV Screen 4th Generation wRfx: NONREACTIVE

## 2020-10-05 ENCOUNTER — Encounter: Payer: Self-pay | Admitting: Nurse Practitioner

## 2020-10-05 LAB — URINE CULTURE

## 2020-10-13 LAB — T3: T3, Total: 90 ng/dL (ref 71–180)

## 2020-10-13 LAB — T4, FREE: Free T4: 1.33 ng/dL (ref 0.82–1.77)

## 2020-10-13 LAB — SPECIMEN STATUS REPORT

## 2021-01-06 ENCOUNTER — Ambulatory Visit: Payer: 59 | Admitting: Nurse Practitioner

## 2021-02-26 IMAGING — US US OB < 14 WEEKS - US OB TV
1 series · 15 of 28 positions shown · non-contrast
Comparison: None.

CLINICAL DATA: Vaginal bleeding

EXAM:
OBSTETRIC <14 WK US AND TRANSVAGINAL OB US
TECHNIQUE: Both transabdominal and transvaginal ultrasound examinations were
performed for complete evaluation of the gestation as well as the
maternal uterus, adnexal regions, and pelvic cul-de-sac.
Transvaginal technique was performed to assess early pregnancy.

[Series 1: us ob < 14 weeks - us ob tv · 15 of 43 slices shown]
[im 1/43]
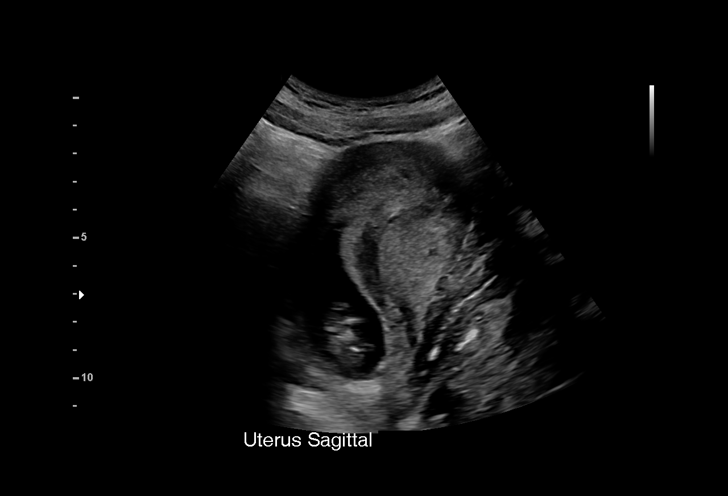
[im 4/43]
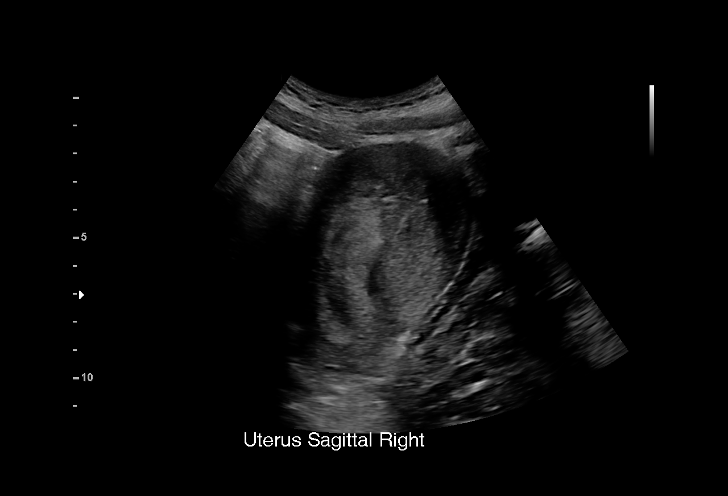
[im 7/43]
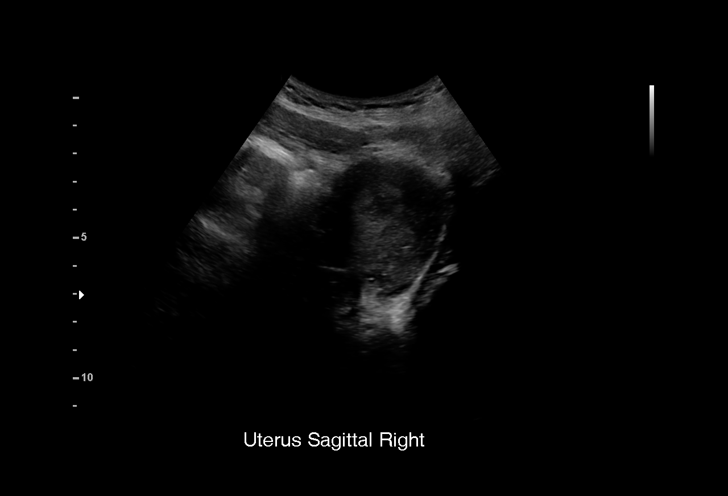
[im 10/43]
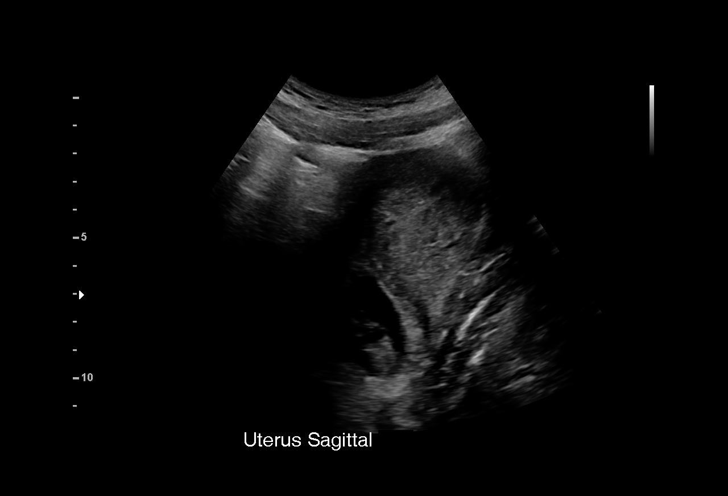
[im 13/43]
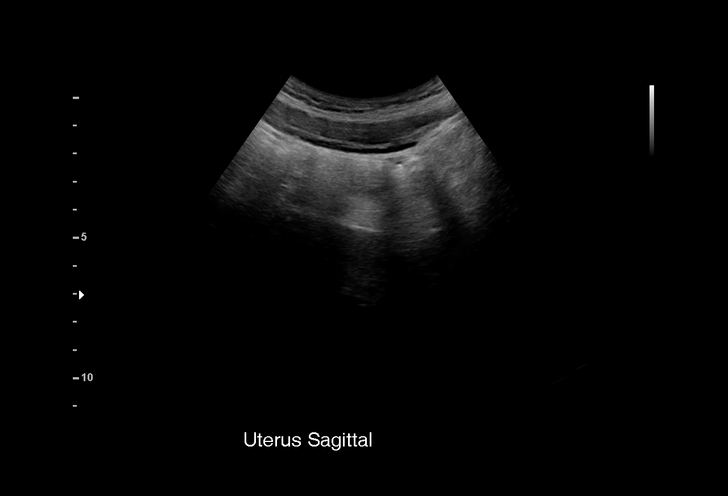
[im 16/43]
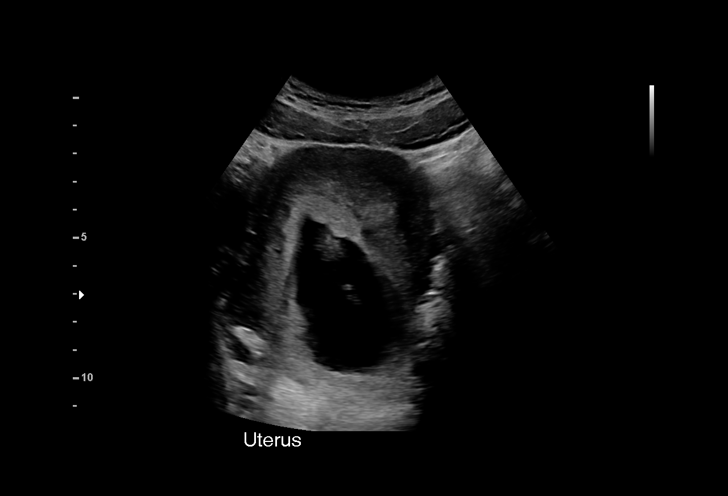
[im 19/43]
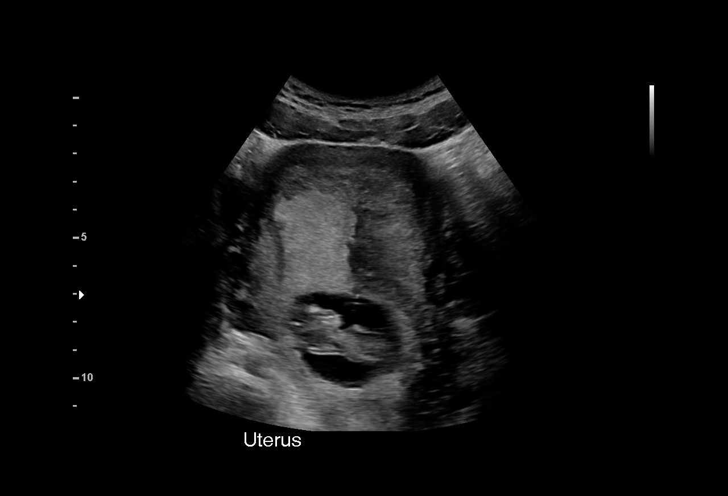
[im 22/43]
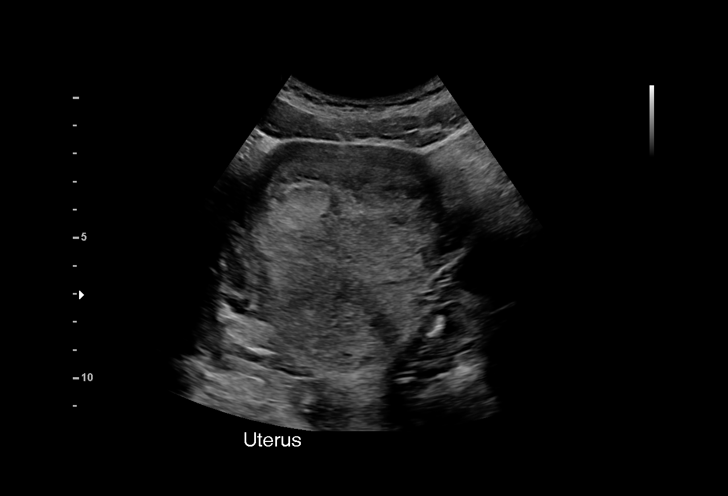
[im 24/43]
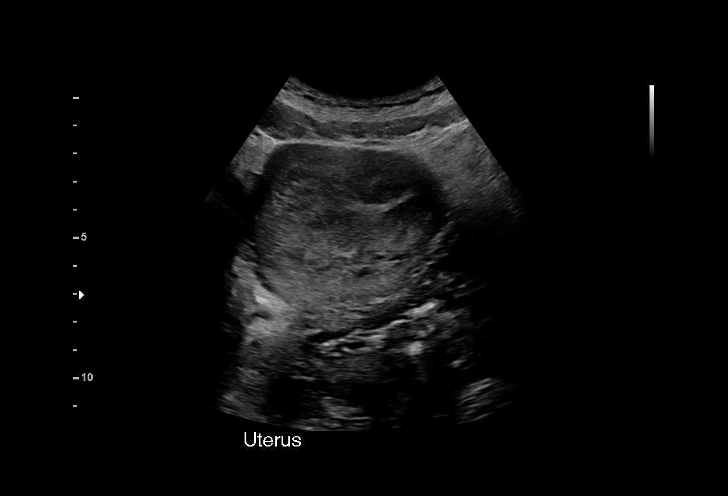
[im 27/43]
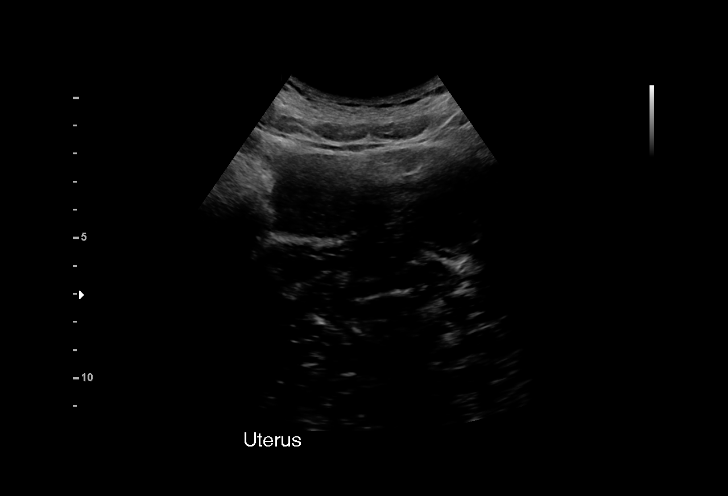
[im 30/43]
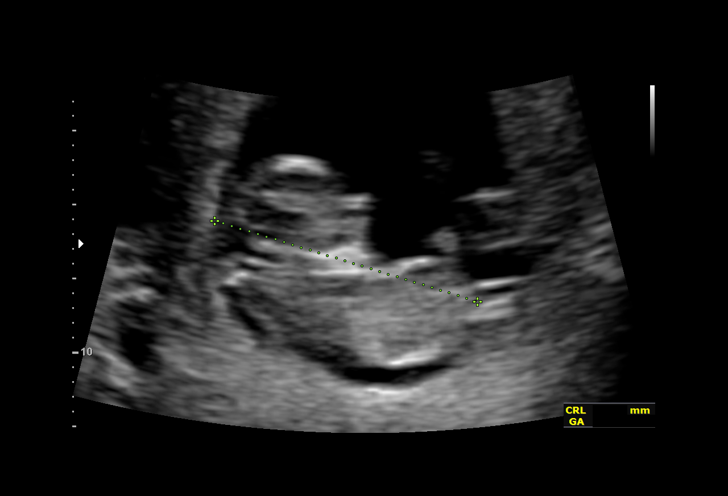
[im 33/43]
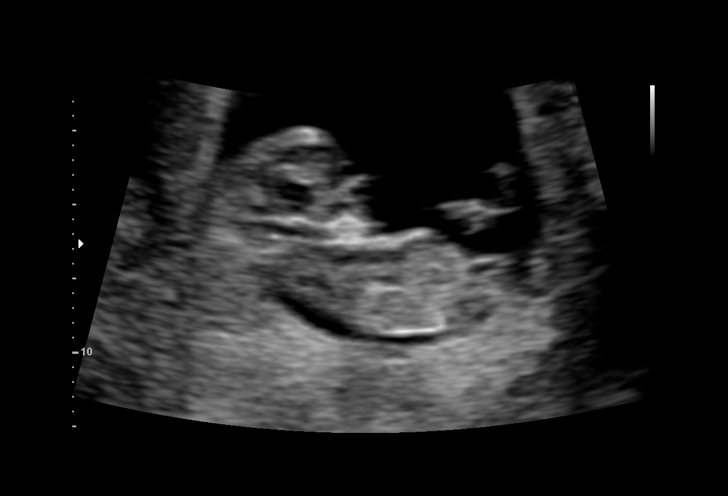
[im 36/43]
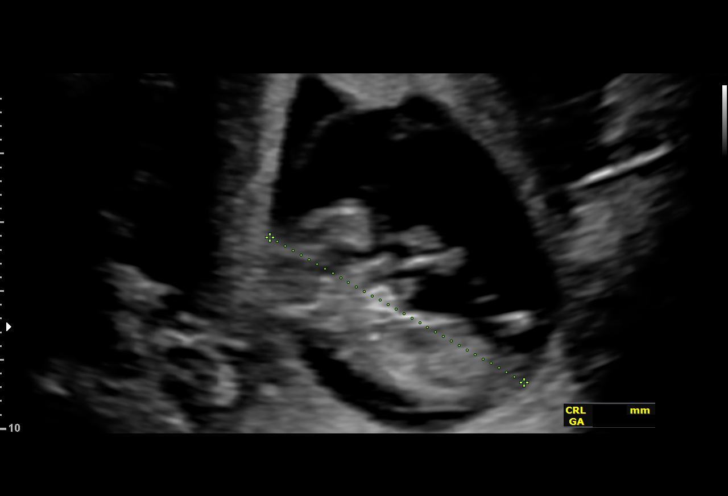
[im 39/43]
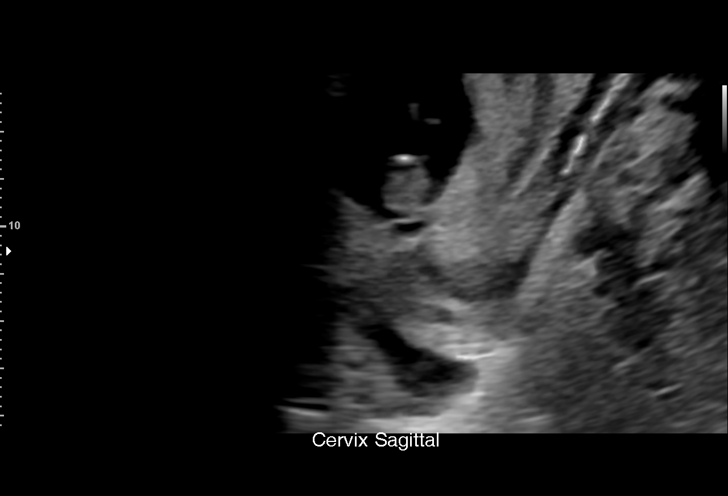
[im 43/43]
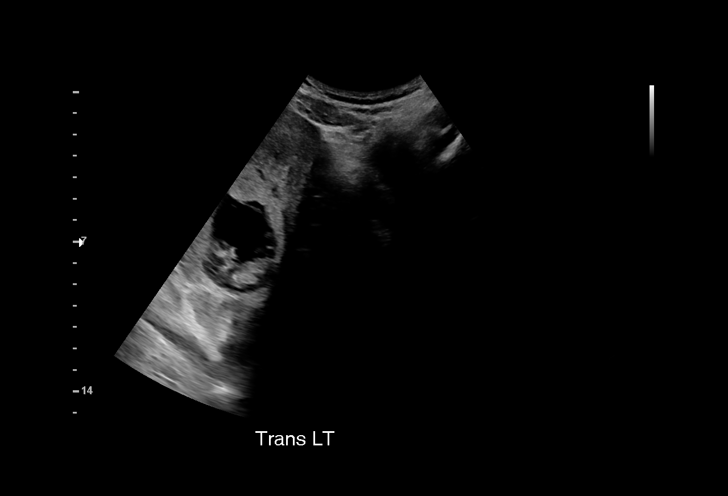

[15 of 28 positions shown; findings below may reference images not displayed]

FINDINGS: Intrauterine gestational sac: Single

Yolk sac:  Visualized

Embryo:  Visualized

Cardiac Activity: Visualized

Heart Rate: 179 bpm

MSD:   mm    w     d

CRL:  42.5 mm   11 w   1 d                  US EDC: 10/11/2019

Subchorionic hemorrhage:  None visualized.

Maternal uterus/adnexae: No adnexal mass or free fluid
IMPRESSION: Eleven week 1 day intrauterine pregnancy. Fetal heart rate 179 beats
per minute. No acute maternal findings.

## 2021-02-28 IMAGING — US US OB TRANSVAGINAL
1 series · 8 of 8 positions shown · non-contrast
Comparison: 03/23/2019.

CLINICAL DATA: Gestation identified on recent ultrasound. Now with
vaginal bleeding.

EXAM:
TRANSVAGINAL OB ULTRASOUND
TECHNIQUE: Transvaginal ultrasound was performed for complete evaluation of the
gestation as well as the maternal uterus, adnexal regions, and
pelvic cul-de-sac.

[Series 1: us ob transvaginal · 8 of 8 slices shown]
[im 1/8]
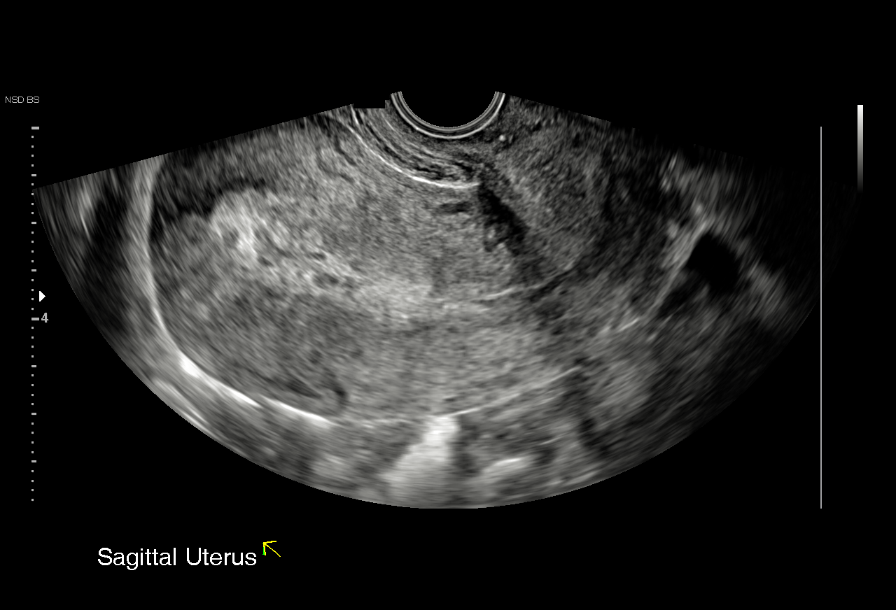
[im 2/8]
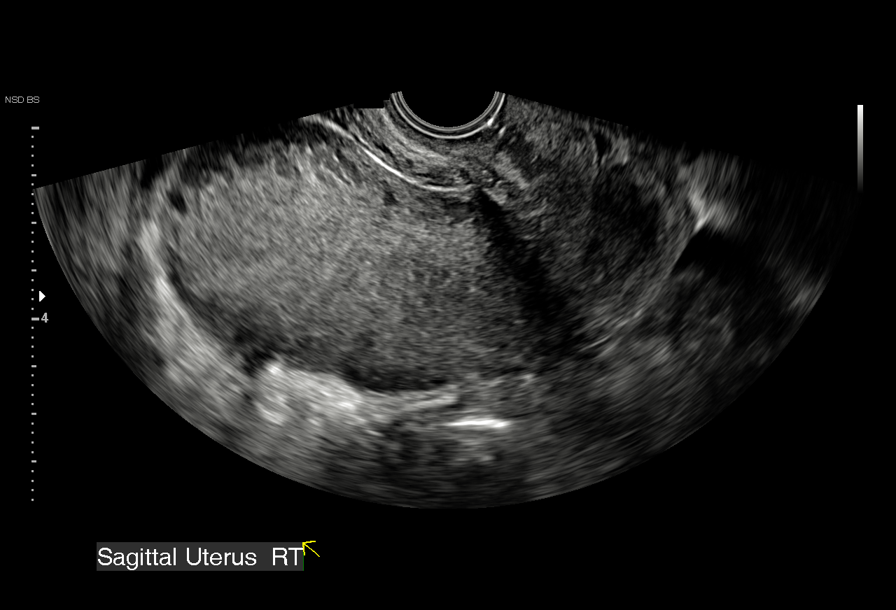
[im 3/8]
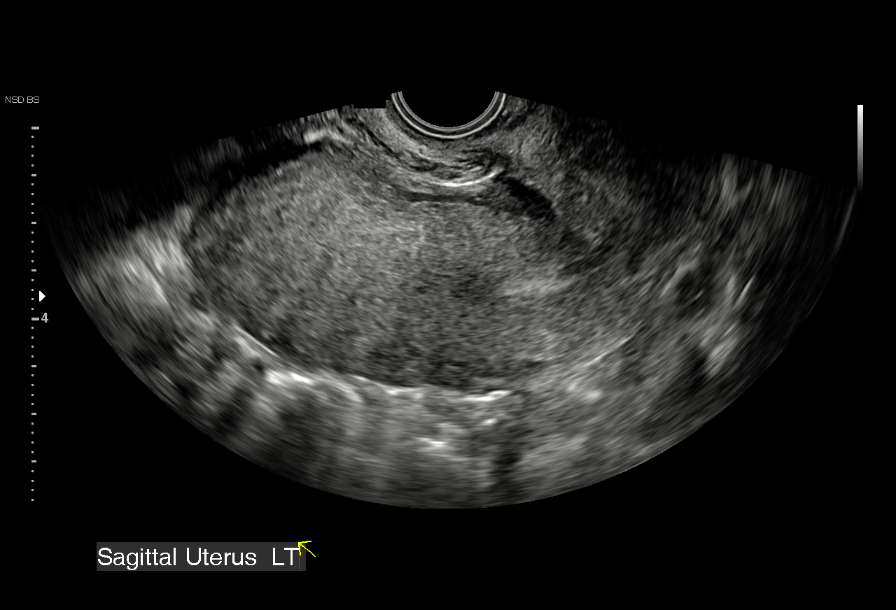
[im 4/8]
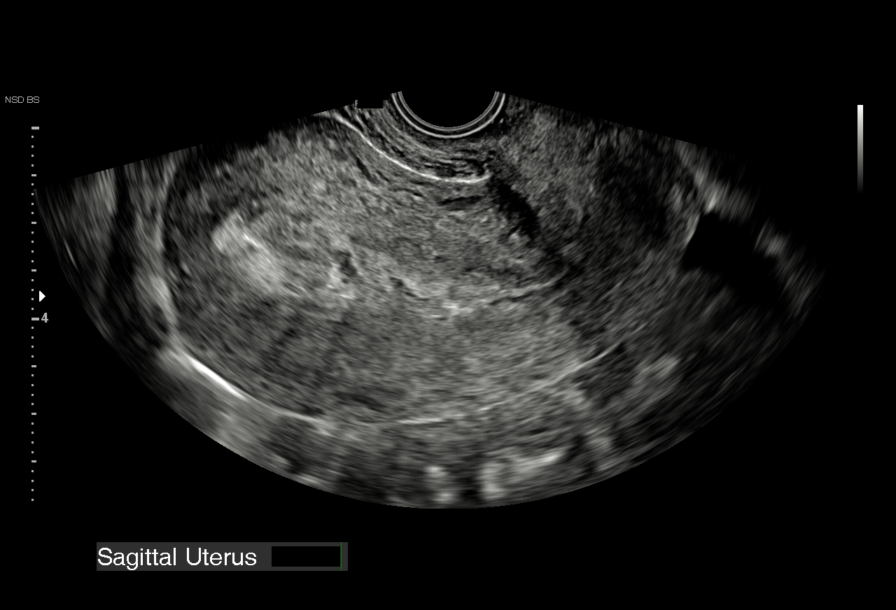
[im 5/8]
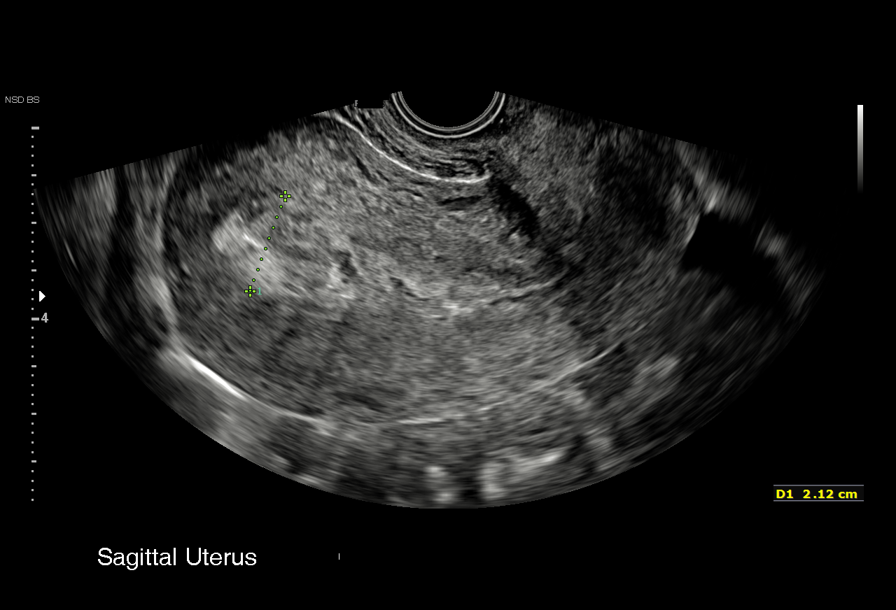
[im 6/8]
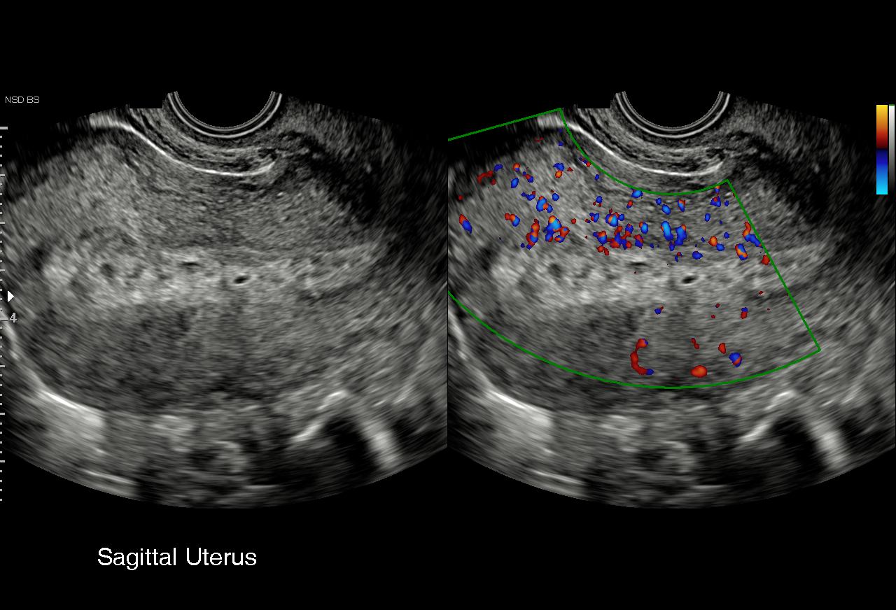
[im 7/8]
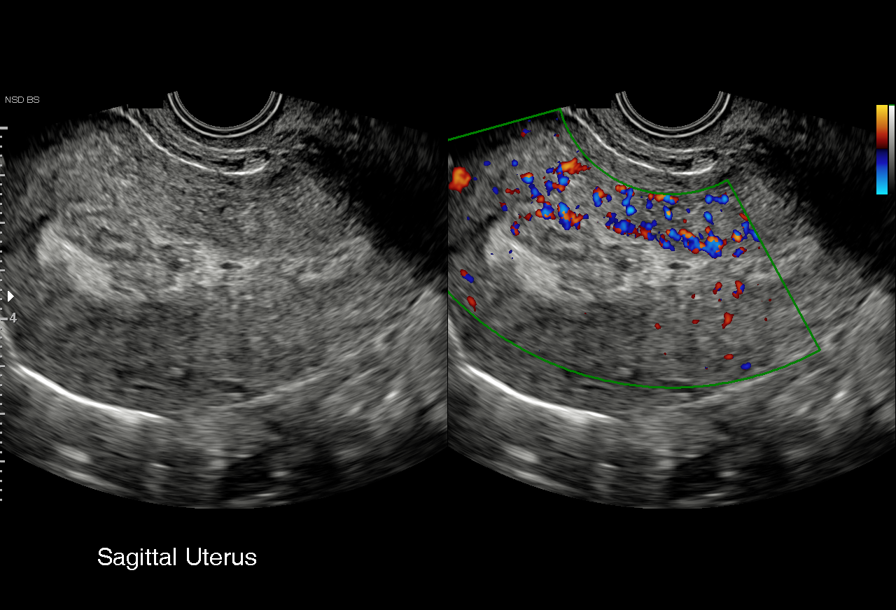
[im 8/8]
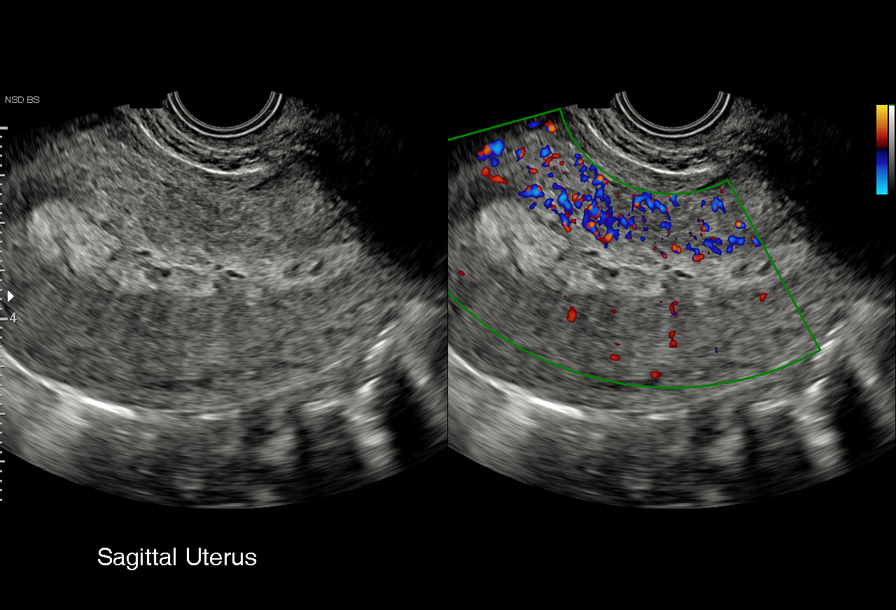

[8 of 8 positions shown; findings below may reference images not displayed]

FINDINGS: Intrauterine gestational sac: Intrauterine gestational sac
identified on previous study is no longer visible.

Endometrial cavity is filled with heterogeneous material.
Endometrial thickness is measured at 21 mm. Color Doppler evaluation
shows some minimal color signal in the endometrium at the anterior
fundus.

Subchorionic hemorrhage:  N/A

Maternal uterus/adnexae: Adnexal regions not visualized.
IMPRESSION: No intrauterine gestational sac with thickened heterogeneous
endometrium measuring up to 21 mm. No discrete or focal mass
identified with minimal color noted along the endometrial contents
of the anterior fundal region. Endometrial thickening likely related
to blood products although component of retained products of
conception towards the fundus not entirely excluded.

## 2021-03-02 IMAGING — US US OB TRANSVAGINAL
1 series · 15 of 28 positions shown · non-contrast
Comparison: 03/25/2019

CLINICAL DATA: Incomplete abortion

EXAM:
TRANSVAGINAL OB ULTRASOUND
TECHNIQUE: Transvaginal ultrasound was performed for complete evaluation of the
gestation as well as the maternal uterus, adnexal regions, and
pelvic cul-de-sac.

[Series 1: us ob transvaginal · 15 of 35 slices shown]
[im 1/35]
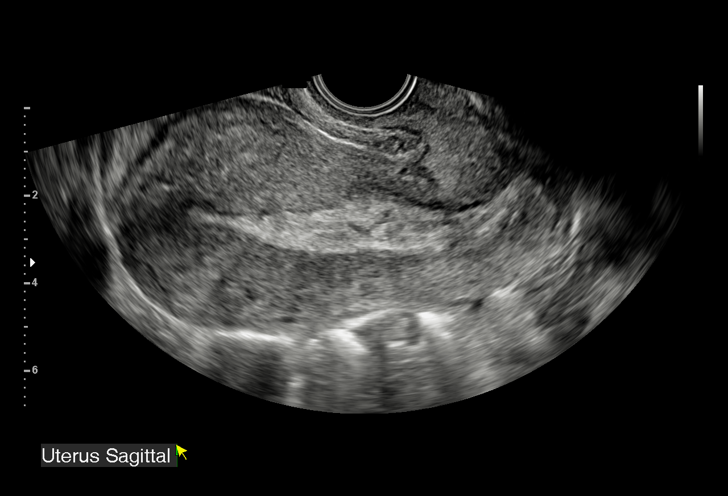
[im 3/35]
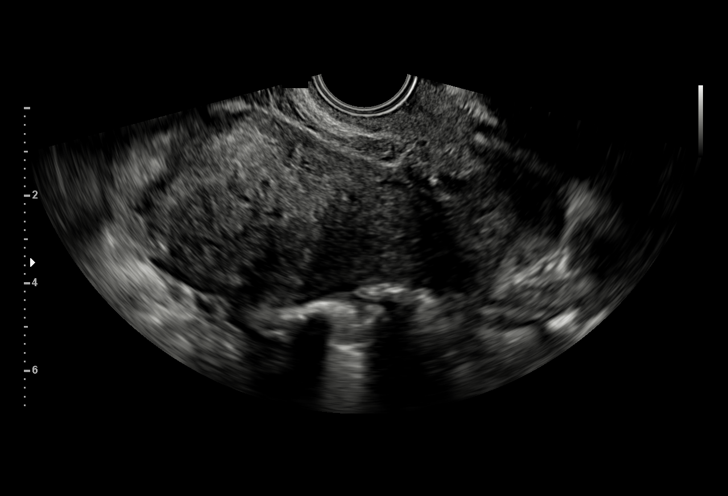
[im 6/35]
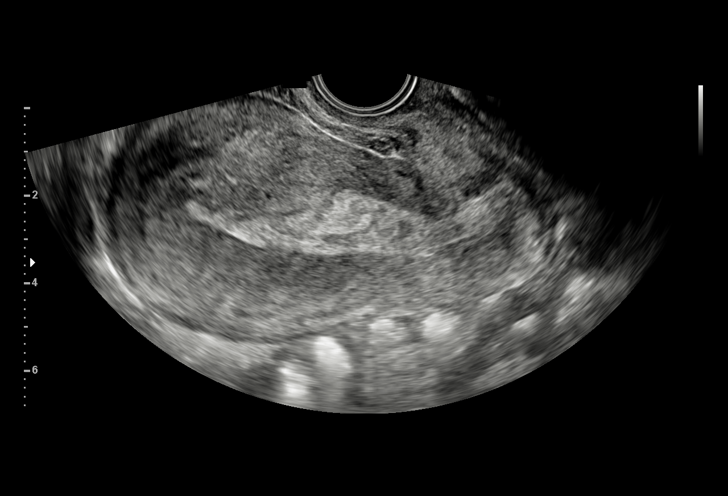
[im 8/35]
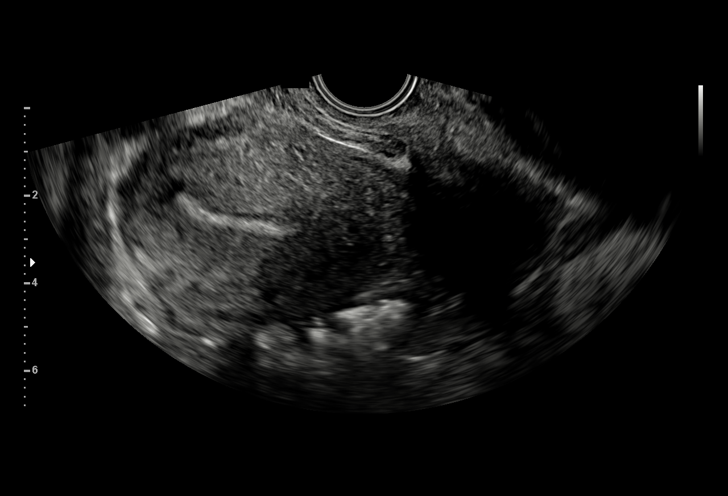
[im 11/35]
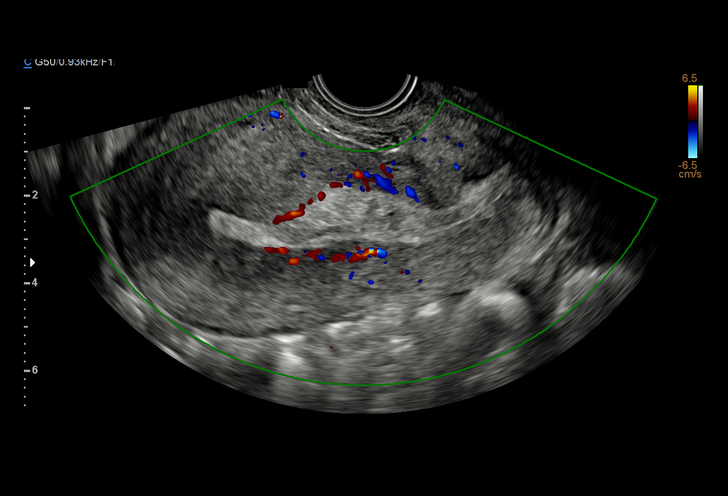
[im 13/35]
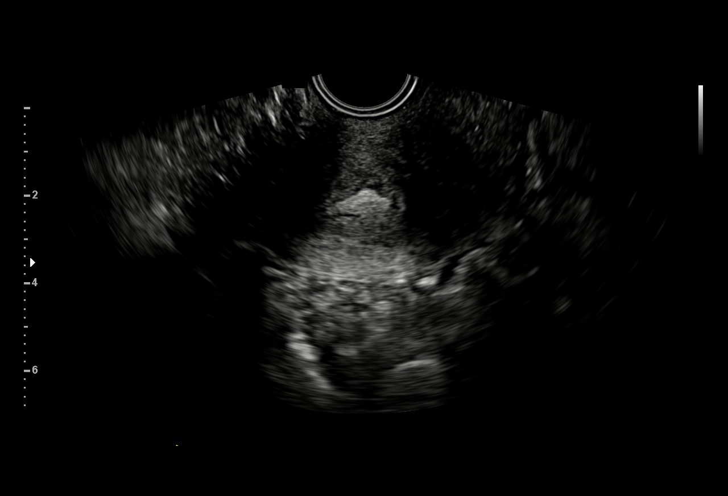
[im 16/35]
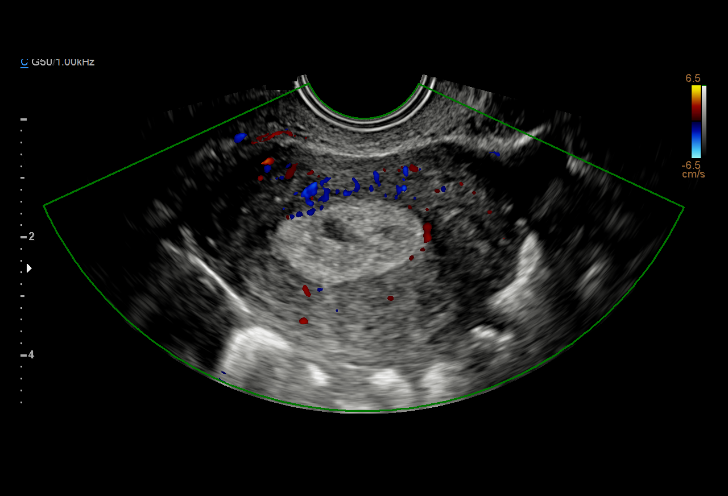
[im 18/35]
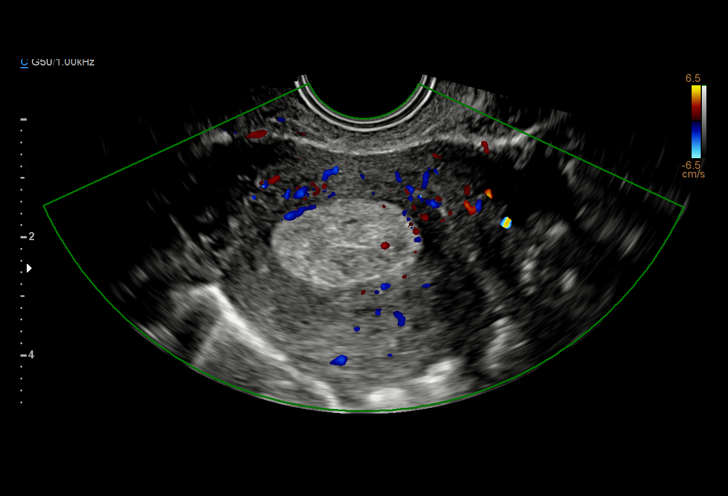
[im 19/35]
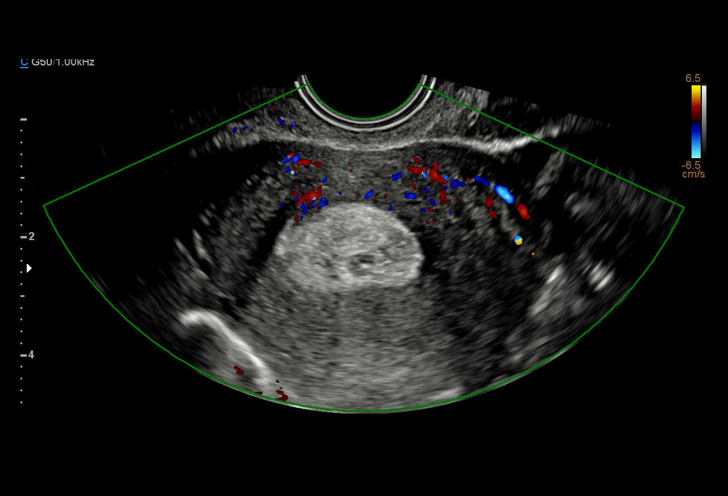
[im 22/35]
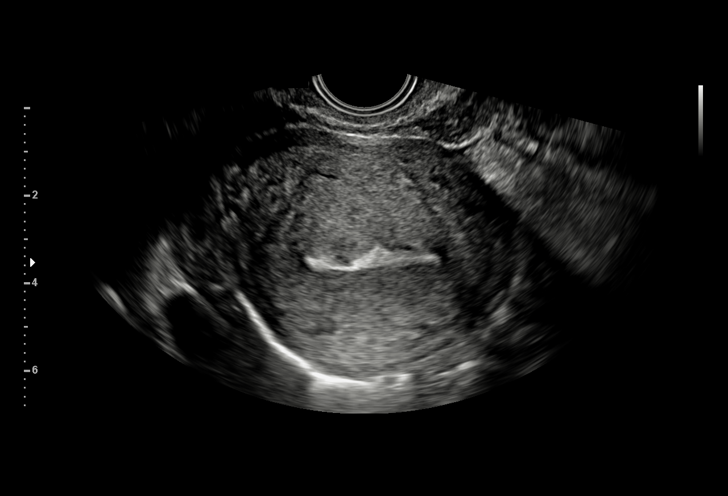
[im 24/35]
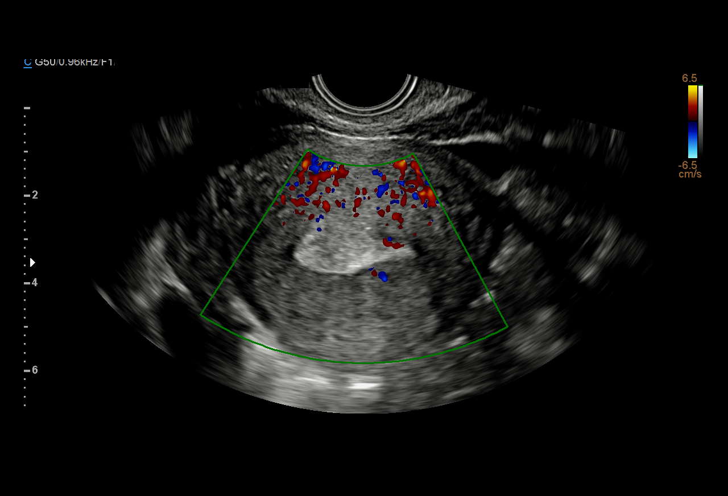
[im 27/35]
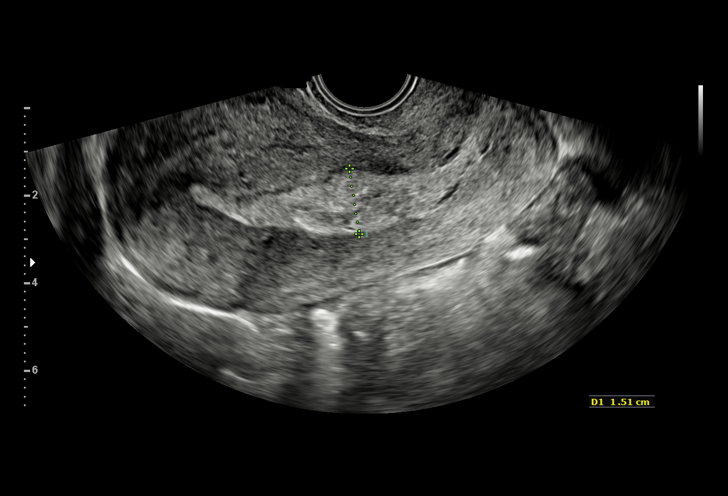
[im 29/35]
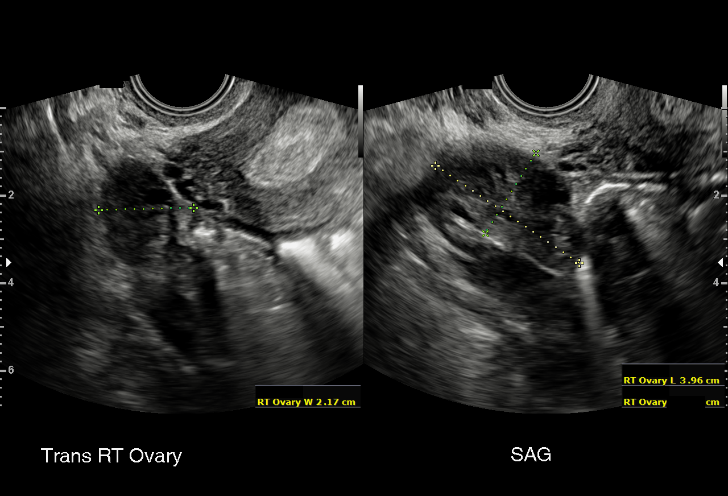
[im 32/35]
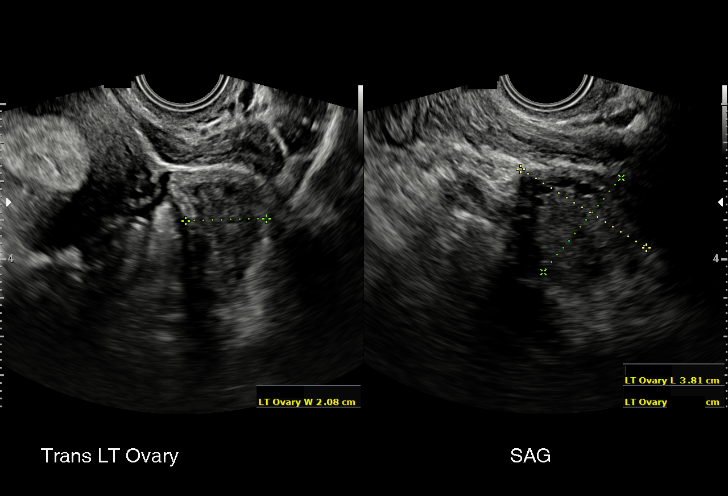
[im 35/35]
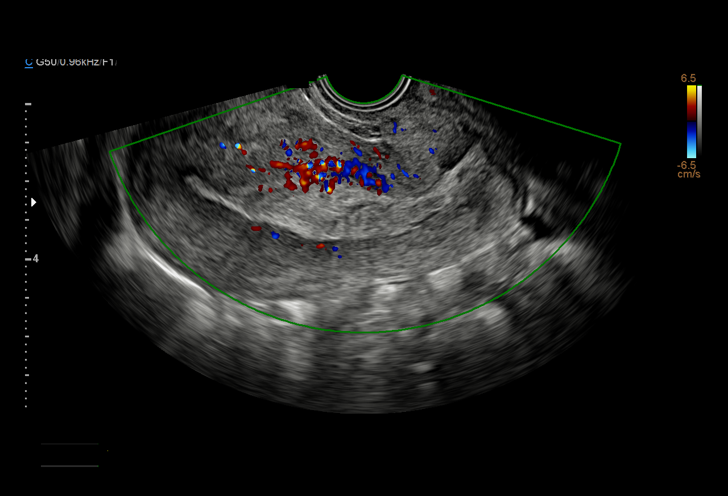

[15 of 28 positions shown; findings below may reference images not displayed]

FINDINGS: Intrauterine gestational sac: Absent

Yolk sac:  N/A

Embryo:  N/A

Cardiac Activity: N/A

Heart Rate: N/A bpm

MSD:   mm    w     d

CRL:     mm    w  d                  US EDC:

Subchorionic hemorrhage:  N/A

Maternal uterus/adnexae:

Uterus anteverted, 10.1 x 5.7 x 5.7 cm in size.

Endometrial complex appears prominent up to 15 mm thick, mildly
heterogeneous.

No endometrial fluid, mass, or retained products of conception
identified.

RIGHT ovary normal size and morphology 2.2 x 4.0 x 2.2 cm.

LEFT ovary normal size and morphology 2.1 x 3.8 x 3.2 cm.

No free pelvic fluid or adnexal masses
IMPRESSION: No intrauterine gestation identified.

Endometrial complex prominent at 15 mm thick though no mass, fluid
or definite retained products of conception identified.

## 2023-10-13 ENCOUNTER — Other Ambulatory Visit: Payer: Self-pay

## 2023-10-13 ENCOUNTER — Emergency Department (HOSPITAL_COMMUNITY)
Admission: EM | Admit: 2023-10-13 | Discharge: 2023-10-13 | Disposition: A | Discharge status: Home / Self Care | Attending: Student | Admitting: Student

## 2023-10-13 ENCOUNTER — Emergency Department (HOSPITAL_COMMUNITY)

## 2023-10-13 ENCOUNTER — Encounter (HOSPITAL_COMMUNITY): Payer: Self-pay

## 2023-10-13 DIAGNOSIS — R0789 Other chest pain: Secondary | ICD-10-CM | POA: Diagnosis present

## 2023-10-13 DIAGNOSIS — R202 Paresthesia of skin: Secondary | ICD-10-CM | POA: Diagnosis not present

## 2023-10-13 DIAGNOSIS — J45909 Unspecified asthma, uncomplicated: Secondary | ICD-10-CM | POA: Insufficient documentation

## 2023-10-13 DIAGNOSIS — R1030 Lower abdominal pain, unspecified: Secondary | ICD-10-CM | POA: Insufficient documentation

## 2023-10-13 DIAGNOSIS — R200 Anesthesia of skin: Secondary | ICD-10-CM | POA: Insufficient documentation

## 2023-10-13 DIAGNOSIS — R079 Chest pain, unspecified: Secondary | ICD-10-CM

## 2023-10-13 LAB — BASIC METABOLIC PANEL WITH GFR
Anion gap: 8 (ref 5–15)
BUN: 15 mg/dL (ref 6–20)
CO2: 24 mmol/L (ref 22–32)
Calcium: 9.6 mg/dL (ref 8.9–10.3)
Chloride: 103 mmol/L (ref 98–111)
Creatinine, Ser: 0.96 mg/dL (ref 0.44–1.00)
GFR, Estimated: >60 mL/min (ref >60)
Glucose, Bld: 101 mg/dL — ABNORMAL HIGH (ref 70–99)
Potassium: 3.8 mmol/L (ref 3.5–5.1)
Sodium: 135 mmol/L (ref 135–145)

## 2023-10-13 LAB — CBC
HCT: 30.7 % — ABNORMAL LOW (ref 36.0–46.0)
Hemoglobin: 10.8 g/dL — ABNORMAL LOW (ref 12.0–15.0)
MCH: 26.2 pg (ref 26.0–34.0)
MCHC: 35.2 g/dL (ref 30.0–36.0)
MCV: 74.5 fL — ABNORMAL LOW (ref 80.0–100.0)
Platelets: 348 K/uL (ref 150–400)
RBC: 4.12 MIL/uL (ref 3.87–5.11)
RDW: 15.6 % — ABNORMAL HIGH (ref 11.5–15.5)
WBC: 5.2 K/uL (ref 4.0–10.5)
nRBC: 0 % (ref 0.0–0.2)

## 2023-10-13 LAB — TROPONIN I (HIGH SENSITIVITY): Troponin I (High Sensitivity): <2 ng/L (ref <18)

## 2023-10-13 LAB — HCG, SERUM, QUALITATIVE: Preg, Serum: NEGATIVE

## 2023-10-13 MED ORDER — PANTOPRAZOLE SODIUM 20 MG PO TBEC: 20.0000 mg | DELAYED_RELEASE_TABLET | Freq: Every day | ORAL | 0 refills | Status: AC

## 2023-10-13 NOTE — ED Triage Notes (Signed)
 Pt reports with generalized chest pain along with numbness and tingling on her left side for 4 days. Pt also reports feeling that she has an ovarian cyst.

## 2023-10-13 NOTE — Discharge Instructions (Addendum)
 You have been evaluated for your symptoms.  Fortunately no concerning findings were noted on today's exam.  You may take Protonix  as your pain could be related to heartburn.  You may take over-the-counter Tylenol as needed for aches and pain.  Follow-up with your doctor for further care.

## 2023-10-13 NOTE — ED Provider Notes (Cosign Needed Addendum)
 [place] EMERGENCY DEPARTMENT AT [facility] Provider Note   CSN: [id number] Arrival date & time: 10/13/23  1333     Patient presents with: Chest Pain   Patricia Parsons is a 23 y.o. female.   The history is provided by the patient and medical records. No language interpreter was used.  Chest Pain    Patricia Parsons is a 23 y.o. female with no significant PMH who presents today from UC with a CC of chest pain, abdominal pain, and left leg numbness. Patient reports intermittent sharp central chest and lower abdominal/pelvic pain with associated numbness of the left leg x3 days. Nothing of note that initiates or worsens the pain. No alleviating factors. Patient has not tried anything for management of pain. Patient also endorses some nausea after eating. No vomiting, changes in appetite, changes in bowel movements, no dizziness or light headedness, no hematuria, dysuria, incontinence, or urgency, no vaginal discharge or pain, no palpitations.   Prior to Admission medications   Medication Sig Start Date End Date Taking? Authorizing Provider  selenium  sulfide (SELSUN ) 1 % LOTN Apply 1 application topically daily. 10/02/20   Moore, Janece, FNP    Allergies: Patient has no known allergies.    Review of Systems  Cardiovascular:  Positive for chest pain.  All other systems reviewed and are negative.   Updated Vital Signs BP (!) 147/94   Pulse 99   Temp 98.7 F (37.1 C)   Resp 15   LMP 09/26/2023   SpO2 100%   Physical Exam Vitals and nursing note reviewed.  Constitutional:      General: She is not in acute distress.    Appearance: She is well-developed.  HENT:     Head: Atraumatic.  Eyes:     Conjunctiva/sclera: Conjunctivae normal.  Cardiovascular:     Rate and Rhythm: Normal rate and regular rhythm.     Pulses: Normal pulses.     Heart sounds: Normal heart sounds.  Pulmonary:     Effort: Pulmonary effort is normal.  Chest:     Chest wall: Tenderness (Mild  tenderness to palpation across the chest wall without any overlying skin changes, crepitus or emphysema) present.  Abdominal:     Palpations: Abdomen is soft.     Tenderness: There is no abdominal tenderness.  Musculoskeletal:        General: Normal range of motion.     Cervical back: Neck supple.     Comments: 5 out of 5 strength to all 4 extremities.  Negative straight leg raise  Skin:    Findings: No rash.  Neurological:     Mental Status: She is alert. Mental status is at baseline.  Psychiatric:        Mood and Affect: Mood normal.     (all labs ordered are listed, but only abnormal results are displayed) Labs Reviewed  BASIC METABOLIC PANEL WITH GFR - Abnormal; Notable for the following components:      Result Value   Glucose, Bld 101 (*)    All other components within normal limits  CBC - Abnormal; Notable for the following components:   Hemoglobin 10.8 (*)    HCT 30.7 (*)    MCV 74.5 (*)    RDW 15.6 (*)    All other components within normal limits  HCG, SERUM, QUALITATIVE  TROPONIN I (HIGH SENSITIVITY)  TROPONIN I (HIGH SENSITIVITY)    EKG: None  Date: 10/13/2023  Rate: 83  Rhythm: normal sinus rhythm  QRS Axis:  normal  Intervals: normal  ST/T Wave abnormalities: normal  Conduction Disutrbances: none  Narrative Interpretation:   Old EKG Reviewed: No significant changes noted    Radiology: DG Chest 2 View Result Date: 10/13/2023 CLINICAL DATA:  Chest pain. EXAM: CHEST - 2 VIEW COMPARISON:  None Available. FINDINGS: The heart size and mediastinal contours are within normal limits. Both lungs are clear. No pleural effusion or pneumothorax. The visualized skeletal structures are unremarkable. IMPRESSION: No active cardiopulmonary disease. Electronically Signed   By: Harrietta Sherry M.D.   On: 10/13/2023 14:06     Procedures   Medications Ordered in the ED - No data to display                                  Medical Decision Making Amount and/or  Complexity of Data Reviewed Labs: ordered. Radiology: ordered.  Risk Prescription drug management.   BP (!) 147/94   Pulse 99   Temp 98.7 F (37.1 C)   Resp 15   LMP 09/26/2023   SpO2 100%   41:26 PM 23 year old female history of asthma presenting with complaints of chest pain.  For the past 4 days patient has had recurrent pain across the chest as well as pain to the lower abdomen and pain to any sensation radiates down her left leg.  Pain is waxing waning sometimes worse with movement.  There is no associate lightheadedness or dizziness no shortness of breath or productive cough diaphoresis nausea vomiting diarrhea or dysuria hematuria.  Maybe occasional nausea.  No recent injury strength activities but patient states she does workout on a fairly regular basis.  No significant cardiac strain.  She denies any significant alcohol or tobacco abuse except for some occasional tobacco use when she goes out to a party.  Denies increasing stress.  On exam patient overall well-appearing she has some mild tenderness to palpation of her chest wall but otherwise no other significant reproducible tenderness.  Abdomen is soft nontender.  Her strength are equal throughout.  Vital signs are overall reassuring.  -Labs ordered, independently viewed and interpreted by me.  Labs remarkable for reassuring labs, normal troponin, negative pregnancy test -The patient was maintained on a cardiac monitor.  I personally viewed and interpreted the cardiac monitored which showed an underlying rhythm of: Normal sinus rhythm -Imaging independently viewed and interpreted by me and I agree with radiologist's interpretation.  Result remarkable for chest x-ray unremarkable -This patient presents to the ED for concern of chest pain, this involves an extensive number of treatment options, and is a complaint that carries with it a high risk of complications and morbidity.  The differential diagnosis includes costochondritis,  pleurisy, pneumonia ACS, PE, shingles, GERD, anxiety -Co morbidities that complicate the patient evaluation includes asthma -Treatment includes reassurance -Reevaluation of the patient after these medicines showed that the patient stayed the same -PCP office notes or outside notes reviewed -Escalation to admission/observation considered: patients feels much better, is comfortable with discharge, and will follow up with PCP -Prescription medication considered, patient comfortable with Protonix  -Social Determinant of Health considered   Patient has a heart score of 0, low risk of Mace, she is PERC negative, low risk of PE.     Final diagnoses:  Nonspecific chest pain    ED Discharge Orders          Ordered    pantoprazole  (PROTONIX ) 20 MG tablet  Daily  10/13/23 1640               Nivia Colon, PA-C 10/13/23 1641    Nivia Colon, PA-C 10/13/23 1642    Kommor, [place], MD 10/14/23 [phone]

## 2023-10-13 NOTE — Medical Student Note (Incomplete)
 [facility]-EMERGENCY DEPT Provider Student Note For educational purposes for Medical, PA and NP students only and not part of the legal medical record.   CSN: [id number] Arrival date & time: 10/13/23  1333      History   Chief Complaint Chief Complaint  Patient presents with   Chest Pain    HPI Patricia Parsons is a 23 y.o. female with no significant PMH who presents today from UC with a CC of chest pain, abdominal pain, and left leg numbness. Patient reports intermittent sharp central chest and lower abdominal/pelvic pain with associated numbness of the left leg x3 days. Nothing of note that initiates or worsens the pain. No alleviating factors. Patient has not tried anything for management of pain. Patient also endorses some nausea after eating. No vomiting, changes in appetite, changes in bowel movements, no dizziness or light headedness, no hematuria, dysuria, incontinence, or urgency, no vaginal discharge or pain, no palpitations.    Chest Pain   Past Medical History:  Diagnosis Date   Asthma    as child    Patient Active Problem List   Diagnosis Date Noted   Miscarriage 03/27/2019    Past Surgical History:  Procedure Laterality Date   NO PAST SURGERIES      OB History     Gravida  1   Para      Term      Preterm      AB      Living         SAB      IAB      Ectopic      Multiple      Live Births               Home Medications    Prior to Admission medications   Medication Sig Start Date End Date Taking? Authorizing Provider  selenium  sulfide (SELSUN ) 1 % LOTN Apply 1 application topically daily. 10/02/20   Georgina Speaks, FNP    Family History Family History  Problem Relation Age of Onset   Healthy Mother    Healthy Father    Cancer Maternal Grandmother     Social History Social History   Tobacco Use   Smoking status: Never   Smokeless tobacco: Never  Vaping Use   Vaping status: Never Used  Substance Use Topics   Alcohol use:  Never   Drug use: Never     Allergies   Patient has no known allergies.   Review of Systems Review of Systems  Cardiovascular:  Positive for chest pain.     Physical Exam Updated Vital Signs BP (!) 147/94   Pulse 99   Temp 98.7 F (37.1 C)   Resp 15   LMP 09/26/2023   SpO2 100%   Physical Exam   ED Treatments / Results  Labs (all labs ordered are listed, but only abnormal results are displayed) Labs Reviewed  BASIC METABOLIC PANEL WITH GFR - Abnormal; Notable for the following components:      Result Value   Glucose, Bld 101 (*)    All other components within normal limits  CBC - Abnormal; Notable for the following components:   Hemoglobin 10.8 (*)    HCT 30.7 (*)    MCV 74.5 (*)    RDW 15.6 (*)    All other components within normal limits  HCG, SERUM, QUALITATIVE  TROPONIN I (HIGH SENSITIVITY)  TROPONIN I (HIGH SENSITIVITY)    EKG  Radiology DG Chest 2  View Result Date: 10/13/2023 CLINICAL DATA:  Chest pain. EXAM: CHEST - 2 VIEW COMPARISON:  None Available. FINDINGS: The heart size and mediastinal contours are within normal limits. Both lungs are clear. No pleural effusion or pneumothorax. The visualized skeletal structures are unremarkable. IMPRESSION: No active cardiopulmonary disease. Electronically Signed   By: Harrietta Sherry M.D.   On: 10/13/2023 14:06    Procedures Procedures (including critical care time)  Medications Ordered in ED Medications - No data to display   Initial Impression / Assessment and Plan / ED Course  I have reviewed the triage vital signs and the nursing notes.  Pertinent labs & imaging results that were available during my care of the patient were reviewed by me and considered in my medical decision making (see chart for details).     ***  Final Clinical Impressions(s) / ED Diagnoses   Final diagnoses:  None    New Prescriptions New Prescriptions   No medications on file

## 2023-12-29 ENCOUNTER — Emergency Department (HOSPITAL_COMMUNITY)

## 2023-12-29 ENCOUNTER — Other Ambulatory Visit: Payer: Self-pay

## 2023-12-29 ENCOUNTER — Encounter (HOSPITAL_COMMUNITY): Payer: Self-pay

## 2023-12-29 ENCOUNTER — Emergency Department (HOSPITAL_COMMUNITY)
Admission: EM | Admit: 2023-12-29 | Discharge: 2023-12-29 | Disposition: A | Discharge status: Home / Self Care | Attending: Emergency Medicine | Admitting: Emergency Medicine

## 2023-12-29 DIAGNOSIS — W540XXA Bitten by dog, initial encounter: Secondary | ICD-10-CM | POA: Diagnosis not present

## 2023-12-29 DIAGNOSIS — S51852A Open bite of left forearm, initial encounter: Secondary | ICD-10-CM | POA: Insufficient documentation

## 2023-12-29 DIAGNOSIS — J45909 Unspecified asthma, uncomplicated: Secondary | ICD-10-CM | POA: Diagnosis not present

## 2023-12-29 LAB — COMPREHENSIVE METABOLIC PANEL WITH GFR
ALT: 19 U/L (ref 0–44)
AST: 30 U/L (ref 15–41)
Albumin: 3.7 g/dL (ref 3.5–5.0)
Alkaline Phosphatase: 44 U/L (ref 38–126)
Anion gap: 9 (ref 5–15)
BUN: 10 mg/dL (ref 6–20)
CO2: 24 mmol/L (ref 22–32)
Calcium: 8.7 mg/dL — ABNORMAL LOW (ref 8.9–10.3)
Chloride: 104 mmol/L (ref 98–111)
Creatinine, Ser: 1.15 mg/dL — ABNORMAL HIGH (ref 0.44–1.00)
GFR, Estimated: >60 mL/min (ref >60)
Glucose, Bld: 103 mg/dL — ABNORMAL HIGH (ref 70–99)
Potassium: 3.7 mmol/L (ref 3.5–5.1)
Sodium: 137 mmol/L (ref 135–145)
Total Bilirubin: 0.6 mg/dL (ref 0.0–1.2)
Total Protein: 7 g/dL (ref 6.5–8.1)

## 2023-12-29 LAB — CBC
HCT: 27.7 % — ABNORMAL LOW (ref 36.0–46.0)
Hemoglobin: 9.2 g/dL — ABNORMAL LOW (ref 12.0–15.0)
MCH: 24.1 pg — ABNORMAL LOW (ref 26.0–34.0)
MCHC: 33.2 g/dL (ref 30.0–36.0)
MCV: 72.5 fL — ABNORMAL LOW (ref 80.0–100.0)
Platelets: 248 K/uL (ref 150–400)
RBC: 3.82 MIL/uL — ABNORMAL LOW (ref 3.87–5.11)
RDW: 15.8 % — ABNORMAL HIGH (ref 11.5–15.5)
WBC: 7.6 K/uL (ref 4.0–10.5)
nRBC: 0 % (ref 0.0–0.2)

## 2023-12-29 LAB — HCG, SERUM, QUALITATIVE: Preg, Serum: NEGATIVE

## 2023-12-29 MED ORDER — AMOXICILLIN-POT CLAVULANATE 875-125 MG PO TABS
1.0000 | ORAL_TABLET | Freq: Once | ORAL | Status: AC
  Administered 2023-12-29: 1 via ORAL
  Filled 2023-12-29: qty 1

## 2023-12-29 MED ORDER — HYDROCODONE-ACETAMINOPHEN 5-325 MG PO TABS
1.0000 | ORAL_TABLET | Freq: Once | ORAL | Status: AC
  Administered 2023-12-29: 1 via ORAL
  Filled 2023-12-29: qty 1

## 2023-12-29 MED ORDER — NAPROXEN 500 MG PO TABS: 500.0000 mg | ORAL_TABLET | Freq: Two times a day (BID) | ORAL | 0 refills | Status: AC

## 2023-12-29 MED ORDER — NAPROXEN 250 MG PO TABS
500.0000 mg | ORAL_TABLET | Freq: Once | ORAL | Status: AC
  Administered 2023-12-29: 500 mg via ORAL
  Filled 2023-12-29: qty 2

## 2023-12-29 MED ORDER — AMOXICILLIN-POT CLAVULANATE 875-125 MG PO TABS: 1.0000 | ORAL_TABLET | Freq: Two times a day (BID) | ORAL | 0 refills | Status: AC

## 2023-12-29 NOTE — Discharge Instructions (Signed)
 You were evaluated in the Emergency Department and after careful evaluation, we did not find any emergent condition requiring admission or further testing in the hospital.  Your exam/testing today is overall reassuring.  Importantly take the Augmentin antibiotic as directed to prevent infection.  Use the Naprosyn twice daily for pain.  Please return to the Emergency Department if you experience any worsening of your condition.   Thank you for allowing us  to be a part of your care.

## 2023-12-29 NOTE — ED Provider Notes (Signed)
 [facility] Emergency Department Provider Note MRN:  [id number]  Arrival date & time: 12/29/23     Chief Complaint   Dog bite History of Present Illness   Patricia Parsons is a 23 y.o. year-old female with no pertinent past medical history presenting to the ED with chief complaint of dog bite.  Patient explains that she was holding a dog toy next to her left arm and her dog jumped up to grab the toy.  The dog grabbed a toy with its teeth but also grabbed some forearm.  Patient sustained laceration to the forearm near the elbow.  This occurred at about 5 PM.  Pain worsening and losing range of motion in the elbow ever since.  Shortly prior to arrival here she was using the bathroom and after urinating and standing up she became lightheaded and passed out.  Review of Systems  A thorough review of systems was obtained and all systems are negative except as noted in the HPI and PMH.   Patient's Health History    Past Medical History:  Diagnosis Date   Asthma    as child    Past Surgical History:  Procedure Laterality Date   NO PAST SURGERIES      Family History  Problem Relation Age of Onset   Healthy Mother    Healthy Father    Cancer Maternal Grandmother     Social History   Socioeconomic History   Marital status: Single    Spouse name: Not on file   Number of children: Not on file   Years of education: Not on file   Highest education level: Not on file  Occupational History   Not on file  Tobacco Use   Smoking status: Never   Smokeless tobacco: Never  Vaping Use   Vaping status: Never Used  Substance and Sexual Activity   Alcohol use: Never   Drug use: Never   Sexual activity: Yes  Other Topics Concern   Not on file  Social History Narrative   Not on file   Social Drivers of Health   Financial Resource Strain: Not on file  Food Insecurity: Not on file  Transportation Needs: Not on file  Physical Activity: Not on file  Stress: Not on  file  Social Connections: Not on file  Intimate Partner Violence: Not on file     Physical Exam   Vitals:   12/29/23 0300 12/29/23 0415  BP: 125/87 124/76  Pulse: 91 81  Resp: 15 18  Temp:    SpO2: 100% 100%    CONSTITUTIONAL: Well-appearing, NAD NEURO/PSYCH:  Alert and oriented x 3, no focal deficits EYES:  eyes equal and reactive ENT/NECK:  no LAD, no JVD CARDIO: Regular rate, well-perfused, normal S1 and S2 PULM:  CTAB no wheezing or rhonchi GI/GU:  non-distended, non-tender MSK/SPINE:  No gross deformities, no edema SKIN:  no rash, atraumatic   *Additional and/or pertinent findings included in MDM below  Diagnostic and Interventional Summary    EKG Interpretation Date/Time:  Thursday December 29 2023 03:19:32 EDT Ventricular Rate:  90 PR Interval:  163 QRS Duration:  87 QT Interval:  357 QTC Calculation: 437 R Axis:   63  Text Interpretation: Sinus rhythm RSR' in V1 or V2, probably normal variant Confirmed by Theadore Sharper [phone]) on 12/29/2023 4:04:57 AM       Labs Reviewed  CBC - Abnormal; Notable for the following components:      Result Value   RBC 3.82 (*)  Hemoglobin 9.2 (*)    HCT 27.7 (*)    MCV 72.5 (*)    MCH 24.1 (*)    RDW 15.8 (*)    All other components within normal limits  COMPREHENSIVE METABOLIC PANEL WITH GFR - Abnormal; Notable for the following components:   Glucose, Bld 103 (*)    Creatinine, Ser 1.15 (*)    Calcium 8.7 (*)    All other components within normal limits  HCG, SERUM, QUALITATIVE    DG Elbow Complete Left  Final Result      Medications  amoxicillin-clavulanate (AUGMENTIN) 875-125 MG per tablet 1 tablet (has no administration in time range)  HYDROcodone-acetaminophen (NORCO/VICODIN) 5-325 MG per tablet 1 tablet (has no administration in time range)  naproxen (NAPROSYN) tablet 500 mg (500 mg Oral Given 12/29/23 0321)     Procedures  /  Critical Care Procedures  ED Course and Medical Decision Making   Initial Impression and Ddx Laceration to the left proximal forearm near the elbow.  Hemostatic.  Dog bite induced and so infection is considered given the worsening pain and range of motion.  Tooth foreign body is considered.  Less likely fracture.  She denies significant traumatic injuries from the syncope and fall.  Likely vasovagal or orthostatic in nature, obtaining screening labs and EKG to ensure no arrhythmia or other concerning features, no significant anemia or electrolyte disturbance, hCG  Past medical/surgical history that increases complexity of ED encounter: None  Interpretation of Diagnostics I personally reviewed the EKG and my interpretation is as follows: Sinus rhythm, no significant change from prior, no concerning features  No significant blood count or electrolyte disturbance.  Patient Reassessment and Ultimate Disposition/Management     X-ray unremarkable, no bony abnormalities, no foreign body.  Appropriate for discharge.  Patient's dog is vaccinated for rabies per patient.  Patient management required discussion with the following services or consulting groups:  None  Complexity of Problems Addressed Acute illness or injury that poses threat of life of bodily function  Additional Data Reviewed and Analyzed Further history obtained from: None  Additional Factors Impacting ED Encounter Risk Prescriptions  Ozell HERO. Theadore, MD [facility] Emergency Medicine [facility] [email or web address]  Final Clinical Impressions(s) / ED Diagnoses     ICD-10-CM   1. Dog bite, initial encounter  [email or web address]       ED Discharge Orders          Ordered    amoxicillin-clavulanate (AUGMENTIN) 875-125 MG tablet  Every 12 hours        12/29/23 0456    naproxen (NAPROSYN) 500 MG tablet  2 times daily        12/29/23 0456             Discharge Instructions Discussed with and Provided to Patient:     Discharge Instructions      You were evaluated in  the Emergency Department and after careful evaluation, we did not find any emergent condition requiring admission or further testing in the hospital.  Your exam/testing today is overall reassuring.  Importantly take the Augmentin antibiotic as directed to prevent infection.  Use the Naprosyn twice daily for pain.  Please return to the Emergency Department if you experience any worsening of your condition.   Thank you for allowing us  to be a part of your care.       Theadore Ozell HERO, MD 12/29/23 [phone]

## 2023-12-29 NOTE — ED Triage Notes (Addendum)
 Pt brought in by [facility] for syncope episode, fell and hit her head around 0100. Also has a dog bite on her left elbow, reports that it is swollen, elbow pain 8/10, PMS in LUE present. AO x4
